# Patient Record
Sex: Male | Born: 1960 | Race: Black or African American | Hispanic: No | Marital: Single | Smoking: Never smoker
Health system: Southern US, Community
[De-identification: ages and names within clinical notes are randomized; demographics above are authoritative.]

## PROBLEM LIST (undated history)

## (undated) DIAGNOSIS — I10 Essential (primary) hypertension: Secondary | ICD-10-CM

## (undated) DIAGNOSIS — E669 Obesity, unspecified: Secondary | ICD-10-CM

## (undated) HISTORY — DX: Essential (primary) hypertension: I10

---

## 2003-07-14 ENCOUNTER — Emergency Department (HOSPITAL_COMMUNITY): Admission: EM | Admit: 2003-07-14 | Discharge: 2003-07-14 | Payer: Self-pay | Admitting: Emergency Medicine

## 2004-11-01 ENCOUNTER — Emergency Department (HOSPITAL_COMMUNITY): Admission: EM | Admit: 2004-11-01 | Discharge: 2004-11-01 | Payer: Self-pay | Admitting: Family Medicine

## 2004-11-01 ENCOUNTER — Emergency Department (HOSPITAL_COMMUNITY): Admission: EM | Admit: 2004-11-01 | Discharge: 2004-11-01 | Payer: Self-pay | Admitting: Emergency Medicine

## 2006-10-04 ENCOUNTER — Emergency Department (HOSPITAL_COMMUNITY): Admission: EM | Admit: 2006-10-04 | Discharge: 2006-10-04 | Payer: Self-pay | Admitting: Emergency Medicine

## 2008-02-19 ENCOUNTER — Emergency Department (HOSPITAL_COMMUNITY): Admission: EM | Admit: 2008-02-19 | Discharge: 2008-02-19 | Payer: Self-pay

## 2010-01-10 ENCOUNTER — Emergency Department (HOSPITAL_COMMUNITY): Admission: EM | Admit: 2010-01-10 | Discharge: 2010-01-10 | Payer: Self-pay | Admitting: Family Medicine

## 2011-03-15 LAB — POCT I-STAT, CHEM 8
BUN: 12 mg/dL (ref 6–23)
Chloride: 106 mEq/L (ref 96–112)
Creatinine, Ser: 1.4 mg/dL (ref 0.4–1.5)
Potassium: 3.8 mEq/L (ref 3.5–5.1)
Sodium: 139 mEq/L (ref 135–145)
TCO2: 27 mmol/L (ref 0–100)
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
## 2012-02-02 ENCOUNTER — Encounter (HOSPITAL_COMMUNITY): Payer: Self-pay | Admitting: *Deleted

## 2012-02-02 ENCOUNTER — Emergency Department (HOSPITAL_COMMUNITY)
Admission: EM | Admit: 2012-02-02 | Discharge: 2012-02-02 | Disposition: A | Discharge status: Home / Self Care | Payer: Self-pay | Attending: Emergency Medicine | Admitting: Emergency Medicine

## 2012-02-02 DIAGNOSIS — M79609 Pain in unspecified limb: Secondary | ICD-10-CM | POA: Insufficient documentation

## 2012-02-02 DIAGNOSIS — M545 Low back pain, unspecified: Secondary | ICD-10-CM | POA: Insufficient documentation

## 2012-02-02 DIAGNOSIS — M5380 Other specified dorsopathies, site unspecified: Secondary | ICD-10-CM | POA: Insufficient documentation

## 2012-02-02 MED ORDER — PREDNISONE 10 MG PO TABS: 20.0000 mg | ORAL_TABLET | Freq: Two times a day (BID) | ORAL | Status: DC

## 2012-02-02 MED ORDER — OXYCODONE-ACETAMINOPHEN 5-325 MG PO TABS
2.0000 | ORAL_TABLET | Freq: Once | ORAL | Status: AC
  Administered 2012-02-02: 2 via ORAL
  Filled 2012-02-02: qty 2

## 2012-02-02 MED ORDER — PREDNISONE 20 MG PO TABS
60.0000 mg | ORAL_TABLET | Freq: Once | ORAL | Status: AC
  Administered 2012-02-02: 60 mg via ORAL
  Filled 2012-02-02: qty 3

## 2012-02-02 MED ORDER — CYCLOBENZAPRINE HCL 10 MG PO TABS: 5.0000 mg | ORAL_TABLET | Freq: Two times a day (BID) | ORAL | Status: AC | PRN

## 2012-02-02 MED ORDER — OXYCODONE-ACETAMINOPHEN 5-325 MG PO TABS: 1.0000 | ORAL_TABLET | Freq: Four times a day (QID) | ORAL | Status: AC | PRN

## 2012-02-02 NOTE — ED Notes (Signed)
Pt is here with left lower back pain that radiates down left leg that started on sunday

## 2012-02-02 NOTE — ED Provider Notes (Signed)
History     CSN: [id number]  Arrival date & time 02/02/12  0800   First MD Initiated Contact with Patient 02/02/12 0813      Chief Complaint  Patient presents with  . Back Pain    (Consider location/radiation/quality/duration/timing/severity/associated sxs/prior treatment) HPI  Back Pain: Patient presents for presents evaluation of low back problems.  Symptoms have been present for since this Saturday and include pain radiating down the left leg. Initial pain was noticed when he woke up in this morning. Symptoms are worst:when walking. Alleviating factors identifiable by patient are rest and ice.  Treatments so far initiated by patient: rest and Tylenol.  Pt does not have a history of lower back pain. Pt denies bowel and urinary incontinence. Pt is ambulatory but describes his walking as slow.    History reviewed. No pertinent past medical history.  History reviewed. No pertinent past surgical history.  No family history on file.  History  Substance Use Topics  . Smoking status: Never Smoker   . Smokeless tobacco: Not on file  . Alcohol Use: Yes     occ      Review of Systems  All other systems reviewed and are negative.    Allergies  Review of patient's allergies indicates no known allergies.  Home Medications   Current Outpatient Rx  Name Route Sig Dispense Refill  . CYCLOBENZAPRINE HCL 10 MG PO TABS Oral Take 0.5 tablets (5 mg total) by mouth 2 (two) times daily as needed for muscle spasms. 20 tablet 0  . OXYCODONE-ACETAMINOPHEN 5-325 MG PO TABS Oral Take 1 tablet by mouth every 6 (six) hours as needed for pain. 15 tablet 0  . PREDNISONE 10 MG PO TABS Oral Take 2 tablets (20 mg total) by mouth 2 (two) times daily. 14 tablet 0    BP 195/108  Pulse 74  Temp(Src) 98 F (36.7 C) (Oral)  Resp 20  Ht 6' (1.829 m)  Wt 300 lb (136.079 kg)  BMI 40.69 kg/m2  SpO2 99%  Physical Exam  Nursing note and vitals reviewed. Constitutional: He is oriented to person,  place, and time. He appears well-developed and well-nourished.  HENT:  Head: Normocephalic and atraumatic.  Eyes: EOM are normal. Pupils are equal, round, and reactive to light.  Neck: Normal range of motion.  Cardiovascular: Normal rate and regular rhythm.   Pulmonary/Chest: Effort normal.  Musculoskeletal: Normal range of motion. He exhibits tenderness.       Lumbar back: He exhibits tenderness (left lower paraspinal tenderness) and spasm. He exhibits normal range of motion, no bony tenderness, no swelling, no edema, no deformity, no laceration, no pain and normal pulse.       Normal strength in both LE bilaterally and normal sensation  Neurological: He is oriented to person, place, and time.  Skin: Skin is warm and dry.    ED Course  Procedures (including critical care time)  Labs Reviewed - No data to display No results found.   1. Low back pain       MDM  Pt has full strength bilaterally and is ambulatory. Pt given prednisone and Percocet in ED and then discharged with Flexiril, prednisone and Percocets. Pt also given Ortho referral in case pain persists.         Dorthula Matas, PA 02/02/12 1544  Dorthula Matas, PA 02/02/12 1545

## 2012-02-04 NOTE — ED Provider Notes (Signed)
Medical screening examination/treatment/procedure(s) were performed by non-physician practitioner and as supervising physician I was immediately available for consultation/collaboration.   Forbes Cellar, MD 02/04/12 1423

## 2012-12-10 ENCOUNTER — Encounter (HOSPITAL_COMMUNITY): Payer: Self-pay | Admitting: Family Medicine

## 2012-12-10 ENCOUNTER — Emergency Department (HOSPITAL_COMMUNITY)
Admission: EM | Admit: 2012-12-10 | Discharge: 2012-12-10 | Disposition: A | Discharge status: Home / Self Care | Payer: Self-pay | Attending: Emergency Medicine | Admitting: Emergency Medicine

## 2012-12-10 ENCOUNTER — Emergency Department (HOSPITAL_COMMUNITY): Payer: Self-pay

## 2012-12-10 DIAGNOSIS — Y939 Activity, unspecified: Secondary | ICD-10-CM | POA: Insufficient documentation

## 2012-12-10 DIAGNOSIS — S40029A Contusion of unspecified upper arm, initial encounter: Secondary | ICD-10-CM | POA: Insufficient documentation

## 2012-12-10 DIAGNOSIS — S8000XA Contusion of unspecified knee, initial encounter: Secondary | ICD-10-CM | POA: Insufficient documentation

## 2012-12-10 DIAGNOSIS — W108XXA Fall (on) (from) other stairs and steps, initial encounter: Secondary | ICD-10-CM | POA: Insufficient documentation

## 2012-12-10 DIAGNOSIS — Y92009 Unspecified place in unspecified non-institutional (private) residence as the place of occurrence of the external cause: Secondary | ICD-10-CM | POA: Insufficient documentation

## 2012-12-10 DIAGNOSIS — IMO0002 Reserved for concepts with insufficient information to code with codable children: Secondary | ICD-10-CM | POA: Insufficient documentation

## 2012-12-10 DIAGNOSIS — W19XXXA Unspecified fall, initial encounter: Secondary | ICD-10-CM

## 2012-12-10 MED ORDER — IBUPROFEN 400 MG PO TABS
600.0000 mg | ORAL_TABLET | Freq: Once | ORAL | Status: AC
  Administered 2012-12-10: 600 mg via ORAL
  Filled 2012-12-10: qty 1

## 2012-12-10 MED ORDER — IBUPROFEN 600 MG PO TABS: 600.0000 mg | ORAL_TABLET | Freq: Four times a day (QID) | ORAL | Status: DC | PRN

## 2012-12-10 MED ORDER — HYDROCODONE-ACETAMINOPHEN 5-500 MG PO TABS: 1.0000 | ORAL_TABLET | Freq: Four times a day (QID) | ORAL | Status: DC | PRN

## 2012-12-10 NOTE — ED Provider Notes (Signed)
History  Scribed for Suzi Roots, MD, the patient was seen in room TR06C/TR06C. This chart was scribed by Candelaria Stagers. The patient's care started at 11:13 AM   CSN: [id number]  Arrival date & time 12/10/12  1008   First MD Initiated Contact with Patient 12/10/12 1108      Chief Complaint  Patient presents with  . Fall    The history is provided by the patient. No language interpreter was used.   [name] is a 51 y.o. male who presents to the Emergency Department complaining of left arm, left flank pain, and left leg pain after falling down a flight of stairs inside his house last night. Pain mild to mod, constant, dull. Non radiating. No neck/back pain. He denies hitting his head or LOC.  He denies headaches or back pain, chest pain, abdominal pain, numbness, or tingling.  Nothing seems to make the sx better or worse. Denies faintness or dizziness, no loc - was mechanical fall, missed a step.   History reviewed. No pertinent past medical history.  History reviewed. No pertinent past surgical history.  History reviewed. No pertinent family history.  History  Substance Use Topics  . Smoking status: Never Smoker   . Smokeless tobacco: Not on file  . Alcohol Use: Yes     Comment: occ      Review of Systems  Cardiovascular: Negative for chest pain.  Gastrointestinal: Negative for abdominal pain.  Musculoskeletal: Positive for arthralgias (left arm pain, left leg pain).  Skin: Negative for wound.  Neurological: Negative for syncope, weakness, numbness and headaches.  All other systems reviewed and are negative.    Allergies  Review of patient's allergies indicates no known allergies.  Home Medications   Current Outpatient Rx  Name  Route  Sig  Dispense  Refill  . ACETAMINOPHEN 500 MG PO TABS   Oral   Take 1,000 mg by mouth every 6 (six) hours as needed. For pain           BP 155/87  Pulse 87  Temp 98 F (36.7 C)  Resp 18  SpO2 100%  Physical  Exam  Nursing note and vitals reviewed. Constitutional: He is oriented to person, place, and time. He appears well-developed and well-nourished. No distress.       Awake, alert, nontoxic appearance with baseline speech for patient.  HENT:  Head: Atraumatic.  Eyes: Conjunctivae normal are normal. Pupils are equal, round, and reactive to light.  Neck: Neck supple.  Cardiovascular: Normal rate.   Pulmonary/Chest: Effort normal. No respiratory distress. He exhibits no tenderness.  Abdominal: Soft. He exhibits no distension. There is no tenderness.       No abd contusion or bruising noted.   Genitourinary:       No cva tenderness  Musculoskeletal: He exhibits no edema.       Left knee pain/tenderness, no effusion. Knee stable. Good rom bil extremities, no other focal bony tenderness noted. Distal pulses palp.  CTLS spine, non tender, aligned, no step off.   Neurological: He is alert and oriented to person, place, and time.       Awake, alert, cooperative and aware of situation; motor intact bil. Steady gait.   Skin: Skin is warm and dry. He is not diaphoretic.  Psychiatric: He has a normal mood and affect.    ED Course  Procedures   DIAGNOSTIC STUDIES: Oxygen Saturation is 100% on room air, normal by my interpretation.    COORDINATION OF  CARE:  11:19 Ordered: DG Knee Complete 4 Views Left   Dg Knee Complete 4 Views Left  12/10/2012  *RADIOLOGY REPORT*  Clinical Data: Fall, left knee pain  LEFT KNEE - COMPLETE 4+ VIEW  Comparison: None.  Findings: No fracture dislocation of the left knee.  No joint effusion.  IMPRESSION: No fracture or dislocation.   Original Report Authenticated By: Genevive Bi, M.D.        MDM  I personally performed the services described in this documentation, which was scribed in my presence. The recorded information has been reviewed and is accurate.  Spine non tender. abd soft nt. Pt ambulatory in ed.   xrays negative.  Motrin po.  Pt stable  for d/c.        Suzi Roots, MD 12/10/12 1225

## 2012-12-10 NOTE — ED Notes (Signed)
Per pt fell down some stairs yesterday and is having left arm pain and side pain. Denies hitting head or LOC

## 2013-08-29 ENCOUNTER — Emergency Department (HOSPITAL_COMMUNITY): Payer: Self-pay

## 2013-08-29 ENCOUNTER — Emergency Department (HOSPITAL_COMMUNITY)
Admission: EM | Admit: 2013-08-29 | Discharge: 2013-08-29 | Disposition: A | Discharge status: Home / Self Care | Payer: Self-pay | Attending: Emergency Medicine | Admitting: Emergency Medicine

## 2013-08-29 DIAGNOSIS — B354 Tinea corporis: Secondary | ICD-10-CM | POA: Insufficient documentation

## 2013-08-29 DIAGNOSIS — R21 Rash and other nonspecific skin eruption: Secondary | ICD-10-CM | POA: Insufficient documentation

## 2013-08-29 MED ORDER — IBUPROFEN 100 MG/5ML PO SUSP
ORAL | Status: AC
  Administered 2013-08-29: 400 mg
  Filled 2013-08-29: qty 20

## 2013-08-29 MED ORDER — IBUPROFEN 400 MG PO TABS: 400.0000 mg | ORAL_TABLET | Freq: Once | ORAL | Status: DC

## 2013-08-29 MED ORDER — CLOTRIMAZOLE-BETAMETHASONE 1-0.05 % EX CREA: TOPICAL_CREAM | Freq: Two times a day (BID) | CUTANEOUS | Status: DC

## 2013-08-29 NOTE — ED Provider Notes (Signed)
CSN: [id number]     Arrival date & time 08/29/13  4098 History   First MD Initiated Contact with Patient 08/29/13 [phone]     No chief complaint on file.  (Consider location/radiation/quality/duration/timing/severity/associated sxs/prior Treatment) Patient is a 52 y.o. male presenting with rash. The history is provided by the patient. No language interpreter was used.  Rash Quality: itchiness, painful and redness   Pain details:    Quality:  Aching   Severity:  Mild   Onset quality:  Gradual   Duration:  2 months   Timing:  Constant   Progression:  Worsening Severity:  Mild Onset quality:  Gradual Duration:  2 months Timing:  Constant Progression:  Worsening Chronicity:  New Context: not exposure to similar rash, not medications, not new detergent/soap and not sick contacts   Relieved by:  Nothing Worsened by:  Contact (walking) Ineffective treatments:  None tried Associated symptoms: no abdominal pain, no diarrhea, no fever, no nausea, no shortness of breath, no sore throat, no throat swelling, no tongue swelling, not vomiting and not wheezing     No past medical history on file. No past surgical history on file. No family history on file. History  Substance Use Topics  . Smoking status: Never Smoker   . Smokeless tobacco: Not on file  . Alcohol Use: Yes     Comment: occ    Review of Systems  Constitutional: Negative for fever.  HENT: Negative for congestion, sore throat and rhinorrhea.   Respiratory: Negative for cough, shortness of breath and wheezing.   Cardiovascular: Negative for chest pain.  Gastrointestinal: Negative for nausea, vomiting, abdominal pain and diarrhea.  Genitourinary: Negative for dysuria and hematuria.  Skin: Positive for rash.  Neurological: Negative for syncope and light-headedness.  All other systems reviewed and are negative.    Allergies  Review of patient's allergies indicates no known allergies.  Home Medications   Current  Outpatient Rx  Name  Route  Sig  Dispense  Refill  . acetaminophen (TYLENOL) 500 MG tablet   Oral   Take 1,000 mg by mouth every 6 (six) hours as needed. For pain         . HYDROcodone-acetaminophen (VICODIN) 5-500 MG per tablet   Oral   Take 1-2 tablets by mouth every 6 (six) hours as needed for pain.   15 tablet   0   . ibuprofen (ADVIL,MOTRIN) 600 MG tablet   Oral   Take 1 tablet (600 mg total) by mouth every 6 (six) hours as needed for pain. Take with food.   20 tablet   0    BP 148/98  Pulse 57  Temp(Src) 98.2 F (36.8 C) (Oral)  Resp 16  Ht 6' (1.829 m)  Wt 280 lb (127.007 kg)  BMI 37.97 kg/m2  SpO2 97% Physical Exam  Nursing note and vitals reviewed. Constitutional: He is oriented to person, place, and time. He appears well-developed and well-nourished. No distress.  HENT:  Head: Normocephalic and atraumatic.  Eyes: EOM are normal. Pupils are equal, round, and reactive to light.  Neck: Normal range of motion. Neck supple.  Cardiovascular: Normal rate, regular rhythm, normal heart sounds and intact distal pulses.  Exam reveals no gallop and no friction rub.   No murmur heard. Pulmonary/Chest: Effort normal and breath sounds normal. No respiratory distress. He has no wheezes. He has no rales. He exhibits tenderness.  Abdominal: Soft. Bowel sounds are normal. He exhibits no distension. There is no tenderness. There is no rebound.  Musculoskeletal: Normal range of motion. He exhibits no edema and no tenderness.  Lymphadenopathy:    He has no cervical adenopathy.  Neurological: He is alert and oriented to person, place, and time.  10x5 patch of raised erythematous rash on left axillary line  Skin: Skin is warm. Rash noted. He is not diaphoretic.  Psychiatric: He has a normal mood and affect. His behavior is normal.    ED Course  Procedures (including critical care time) Labs Review Labs Reviewed - No data to display Imaging Review Dg Chest 2 View  08/29/2013    *RADIOLOGY REPORT*  Clinical Data: Chest wall rash  CHEST - 2 VIEW  Comparison: None.  Findings: The heart and pulmonary vascularity are within normal limits.  The lungs are clear bilaterally.  No acute bony abnormality is seen.  IMPRESSION: No acute abnormality noted.   Original Report Authenticated By: Alcide Clever, M.D.    MDM  No diagnosis found. 8:26 AM Pt is a 52 y.o. male who presents to the ED with rash. Rash noted 2 months ago. Rash described as painful/itchy/red. No change in soaps/detergents/diet. Denies wheezing, shortness of breath, nausea, vomiting or chest pain. Denies contact with poison ivy/oak. Denies fever, chills. Pt stated has pain with palpation of there rash and with walking. Pt stated at times rash is itchy and painful   On exam: AFVSS, macular patch non vesicular. Linear consistent with scabies. No Rash noted on palms and soles. TTP around rash, lungs CTA.  Doubt shingles given non vesicular. No anaphylaxis like symptoms. Appears to be tinea corporis. Given pain with walking an palpation, will obtain CXr to rule out rib fracture or ptx. No history of trauma or falls per pt.  CXR Pa/LAt for rash per my read showed no ptx, no rib fractures. Will have pt get Lotrisone for tinea infection and see PCP in 1 week.  9:13 AM: I have discussed the diagnosis/risks/treatment options with the patient and believe the pt to be eligible for discharge home to follow-up with PCP in 1 week. We also discussed returning to the ED immediately if new or worsening sx occur. We discussed the sx which are most concerning (e.g., worsening rash, anaphylaxis symptoms) that necessitate immediate return. Any new prescriptions provided to the patient are listed below.   New Prescriptions   CLOTRIMAZOLE-BETAMETHASONE (LOTRISONE) CREAM    Apply topically 2 (two) times daily.    The patient appears reasonably screened and/or stabilized for discharge and I doubt any other medical condition or other [facility]  requiring further screening, evaluation or treatment in the ED at this time prior to discharge . Pt in agreement with discharge plan. Return precautions given. Pt discharged VSS   Imaging reviewed by myself and considered in medical decision making if ordered.  Imaging interpreted by radiology. Pt was discussed with my attending, Dr. Ron Agee, MD 08/29/13 [phone]

## 2013-08-29 NOTE — ED Provider Notes (Signed)
I saw and evaluated the patient, reviewed the resident's note and I agree with the findings and plan.  This patient is a 52 year old otherwise healthy male presents with complaints of rash on the left torso on the lateral aspect of the chest wall. This has been present for approximately 2 months and has been worsening. He describes it as itchy and also burns. He denies any fevers or chills. He denies any new exposures.  On exam vitals are stable the patient is afebrile. He is awake alert and oriented. Examination of the heart and lungs is unremarkable. There is a 10 cm x 5 cm scaly, well-demarcated, pruritic area to the left lateral chest wall under the left axilla.  Chest x-ray was performed which was negative for pneumonia or pneumothorax. It appears as though this rash is some form of tinea corporis. This will be treated with Lotrisone. This does not appear to be a shingles as the rash is not vesicular and is not in any dermatomal pattern. He will return should his symptoms worsen and followup if he does not improve.  Geoffery Lyons, MD 08/29/13 [phone]

## 2013-08-29 NOTE — ED Notes (Signed)
Pt states that he has had a rash to his L rib cage x 1 week that is associated with pain and palpation.  No distress noted.

## 2013-12-15 ENCOUNTER — Emergency Department (HOSPITAL_COMMUNITY)
Admission: EM | Admit: 2013-12-15 | Discharge: 2013-12-15 | Disposition: A | Discharge status: Home / Self Care | Payer: Self-pay | Attending: Emergency Medicine | Admitting: Emergency Medicine

## 2013-12-15 ENCOUNTER — Encounter (HOSPITAL_COMMUNITY): Payer: Self-pay | Admitting: Emergency Medicine

## 2013-12-15 ENCOUNTER — Emergency Department (HOSPITAL_COMMUNITY): Payer: Self-pay

## 2013-12-15 DIAGNOSIS — Z79899 Other long term (current) drug therapy: Secondary | ICD-10-CM | POA: Insufficient documentation

## 2013-12-15 DIAGNOSIS — R0789 Other chest pain: Secondary | ICD-10-CM | POA: Insufficient documentation

## 2013-12-15 LAB — CBC WITH DIFFERENTIAL/PLATELET
Eosinophils Absolute: 0 10*3/uL (ref 0.0–0.7)
Eosinophils Relative: 0 % (ref 0–5)
HCT: 43.1 % (ref 39.0–52.0)
Hemoglobin: 14.8 g/dL (ref 13.0–17.0)
Lymphocytes Relative: 11 % — ABNORMAL LOW (ref 12–46)
Lymphs Abs: 1.2 10*3/uL (ref 0.7–4.0)
MCH: 25 pg — ABNORMAL LOW (ref 26.0–34.0)
MCV: 72.7 fL — ABNORMAL LOW (ref 78.0–100.0)
Monocytes Relative: 6 % (ref 3–12)
Platelets: 177 10*3/uL (ref 150–400)
RBC: 5.93 MIL/uL — ABNORMAL HIGH (ref 4.22–5.81)
WBC: 10.9 10*3/uL — ABNORMAL HIGH (ref 4.0–10.5)

## 2013-12-15 LAB — POCT I-STAT, CHEM 8
BUN: 10 mg/dL (ref 6–23)
Creatinine, Ser: 1.2 mg/dL (ref 0.50–1.35)
Glucose, Bld: 96 mg/dL (ref 70–99)
Hemoglobin: 17 g/dL (ref 13.0–17.0)
Sodium: 137 mEq/L (ref 135–145)
TCO2: 21 mmol/L (ref 0–100)

## 2013-12-15 LAB — POCT I-STAT TROPONIN I

## 2013-12-15 MED ORDER — AZITHROMYCIN 250 MG PO TABS: 250.0000 mg | ORAL_TABLET | Freq: Every day | ORAL | Status: DC

## 2013-12-15 NOTE — ED Notes (Signed)
Patient transported to X-ray 

## 2013-12-15 NOTE — ED Notes (Signed)
Pt reports right sided chest pain onset last night while sleeping. Pt denies pain at present.

## 2013-12-15 NOTE — ED Provider Notes (Signed)
CSN: [id number]     Arrival date & time 12/15/13  0747 History   First MD Initiated Contact with Patient 12/15/13 [phone]     Chief Complaint  Patient presents with  . Chest Pain   (Consider location/radiation/quality/duration/timing/severity/associated sxs/prior Treatment) HPI  52 year old male with no significant past medical history presents complaining of chest pain. Patient report last night he was awoke from sleep with sharp pain to his right chest. Pain is intermittent, non radiating.  Pain lasting for 5-10 minutes resolved. He was awoke again with the same pain around 6 AM which lasted for 5 minutes and has resolved. Although pain has fully resolved he has never had this pain before and would like to have evaluated in the ED. States he did drink some water last night which has helped. Nothing seems to make the symptom worse. There is no associated exertional chest pain, lightheadedness, dizziness, shortness of breath, diaphoresis. He denies fever, chills, headache, runny nose, sneezing, cough, back pain, abdominal pain, or rash. 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 Denies any recent strenuous activities. He is a nonsmoker. Denies any recent risk factors for PE or DVT. No history of cancer, recent surgery, prolonged bed rest, long trip, calf pain or swelling. No history of heartburn.   History reviewed. No pertinent past medical history. History reviewed. No pertinent past surgical history. No family history on file. History  Substance Use Topics  . Smoking status: Never Smoker   . Smokeless tobacco: Not on file  . Alcohol Use: Yes     Comment: beer    Review of Systems  All other systems reviewed and are negative.    Allergies  Review of patient's allergies indicates no known allergies.  Home Medications   Current Outpatient Rx  Name  Route  Sig  Dispense  Refill  . clotrimazole-betamethasone (LOTRISONE) cream   Topical   Apply topically 2 (two) times daily.   30 g   1    BP 177/94  Pulse 89  Temp(Src) 98.9 F (37.2 C) (Oral)  Resp 16  Ht 6' (1.829 m)  Wt 280 lb (127.007 kg)  BMI 37.97 kg/m2  SpO2 98% Physical Exam  Nursing note and vitals reviewed. Constitutional: He appears well-developed and well-nourished. No distress.  Awake, alert, nontoxic appearance  HENT:  Head: Atraumatic.  Eyes: Conjunctivae are normal. Right eye exhibits no discharge. Left eye exhibits no discharge.  Neck: Normal range of motion. Neck supple. No JVD present.  Cardiovascular: Normal rate and regular rhythm.   Pulmonary/Chest: Effort normal. No respiratory distress. He exhibits no tenderness.  Abdominal: Soft. There is no tenderness. There is no rebound.  Musculoskeletal: He exhibits no edema and no tenderness.  ROM appears intact, no obvious focal weakness  Neurological: He is alert.  Skin: Skin is warm and dry. No rash noted.  Psychiatric: He has a normal mood and affect.    ED Course  Procedures (including critical care time)   Date: 12/15/2013  Rate: 90  Rhythm: normal sinus rhythm  QRS Axis: normal  Intervals: normal  ST/T Wave abnormalities: normal  Conduction Disutrbances: none  Narrative Interpretation:   Old EKG Reviewed: none for comparison      Pt with intermittent sharp chest pain to R chest.  Is PERC negative, TIMI 0.  Currently sxs free.    10:22 AM CXR cannot exclude R middle lobe pna.  Pt with mildly elevated WBC of 10.9 and a left shift.  However he is afebrile, denies productive  cough.  I will provide abx for pt to use if he develops fever, cough, or worsening cp.  Otherwise, normal ECG, trop neg, and electrolytes are reassuring.  Pt request a note for court as he missed his court date today.      Labs Review Labs Reviewed  CBC WITH DIFFERENTIAL - Abnormal; Notable for the following:    WBC 10.9 (*)    RBC 5.93 (*)    MCV 72.7 (*)    MCH 25.0 (*)    RDW 15.9 (*)    Neutrophils Relative % 83 (*)    Neutro Abs 9.0 (*)     Lymphocytes Relative 11 (*)    All other components within normal limits  POCT I-STAT, CHEM 8  POCT I-STAT TROPONIN I   Imaging Review Dg Chest 2 View  12/15/2013   CLINICAL DATA:  Chest pain.  EXAM: CHEST  2 VIEW  COMPARISON:  08/29/2013.  FINDINGS: Poor inspiration with mild subsegmental atelectasis. Mild developing infiltrate right middle lobe cannot be excluded. Heart size and pulmonary vascularity normal. No pleural effusion or pneumothorax. No acute osseous abnormality.  IMPRESSION: Poor inspiration with mild atelectatic changes. Mild infiltrate right middle lobe cannot be excluded.   Electronically Signed   By: Maisie Fus  Register   On: 12/15/2013 08:53    EKG Interpretation   None       MDM   1. Chest pain, atypical    BP 152/83  Pulse 67  Temp(Src) 98.9 F (37.2 C) (Oral)  Resp 17  Ht 6' (1.829 m)  Wt 280 lb (127.007 kg)  BMI 37.97 kg/m2  SpO2 97%  I have reviewed nursing notes and vital signs. I personally reviewed the imaging tests through PACS system  I reviewed available ER/hospitalization records thought the EMR     Fayrene Helper, PA-C 12/15/13 1025

## 2013-12-16 NOTE — ED Provider Notes (Signed)
Medical screening examination/treatment/procedure(s) were performed by non-physician practitioner and as supervising physician I was immediately available for consultation/collaboration.  EKG Interpretation   None        Johanne Mcglade R. Rolf Fells, MD 12/16/13 0715 

## 2016-05-14 ENCOUNTER — Emergency Department (HOSPITAL_COMMUNITY)
Admission: EM | Admit: 2016-05-14 | Discharge: 2016-05-14 | Disposition: A | Discharge status: Home / Self Care | Payer: Self-pay | Attending: Emergency Medicine | Admitting: Emergency Medicine

## 2016-05-14 ENCOUNTER — Encounter (HOSPITAL_COMMUNITY): Payer: Self-pay | Admitting: Emergency Medicine

## 2016-05-14 DIAGNOSIS — I10 Essential (primary) hypertension: Secondary | ICD-10-CM | POA: Insufficient documentation

## 2016-05-14 DIAGNOSIS — Z792 Long term (current) use of antibiotics: Secondary | ICD-10-CM | POA: Insufficient documentation

## 2016-05-14 DIAGNOSIS — R0602 Shortness of breath: Secondary | ICD-10-CM | POA: Insufficient documentation

## 2016-05-14 LAB — BASIC METABOLIC PANEL
Anion gap: 11 (ref 5–15)
BUN: 12 mg/dL (ref 6–20)
CHLORIDE: 104 mmol/L (ref 101–111)
CO2: 22 mmol/L (ref 22–32)
CREATININE: 1.1 mg/dL (ref 0.61–1.24)
Calcium: 9 mg/dL (ref 8.9–10.3)
GFR calc Af Amer: >60 mL/min (ref >60)
GFR calc non Af Amer: >60 mL/min (ref >60)
Glucose, Bld: 100 mg/dL — ABNORMAL HIGH (ref 65–99)
Potassium: 4 mmol/L (ref 3.5–5.1)
SODIUM: 137 mmol/L (ref 135–145)

## 2016-05-14 MED ORDER — AMLODIPINE BESYLATE 10 MG PO TABS: 5.0000 mg | ORAL_TABLET | Freq: Every day | ORAL | Status: DC

## 2016-05-14 NOTE — ED Provider Notes (Signed)
CSN: [id number]     Arrival date & time 05/14/16  [id number] History   First MD Initiated Contact with Patient 05/14/16 [phone]     Chief Complaint  Patient presents with  . 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 He took his blood pressure was 200/100. Patient does not see a physician regularly. No known history of hypertension. No medications taken yesterday. Has felt normal today. Will want to come to checked out. Denies any associated chest pain. No recent illnesses.  Patient is a 55 y.o. male presenting with hypertension. The history is provided by the patient.  Hypertension This is a new problem. The current episode started more than 1 week ago. The problem occurs constantly. The problem has been resolved. Associated symptoms include shortness of breath. Pertinent negatives include no chest pain.    History reviewed. No pertinent past medical history. History reviewed. No pertinent past surgical history. History reviewed. No pertinent family history. Social History  Substance Use Topics  . Smoking status: Never Smoker   . Smokeless tobacco: None  . Alcohol Use: Yes     Comment: beer    Review of Systems  Respiratory: Positive for shortness of breath.   Cardiovascular: Negative for chest pain.  All other systems reviewed and are negative.     Allergies  Review of patient's allergies indicates no known allergies.  Home Medications   Prior to Admission medications   Medication Sig Start Date End Date Taking? Authorizing Provider  acetaminophen (TYLENOL) 325 MG tablet Take 650 mg by mouth every 6 (six) hours as needed.    Historical Provider, MD  azithromycin (ZITHROMAX) 250 MG tablet Take 1 tablet (250 mg total) by mouth daily. 12/15/13   Fayrene HelperBowie Tran, PA-C   BP 170/90 mmHg  Pulse 72  Temp(Src) 97.6 F (36.4 C) (Oral)  Resp 20   SpO2 97% Physical Exam  Constitutional: He is oriented to person, place, and time. He appears well-developed and well-nourished.  Non-toxic appearance. No distress.  HENT:  Head: Normocephalic and atraumatic.  Eyes: Conjunctivae, EOM and lids are normal. Pupils are equal, round, and reactive to light.  Neck: Normal range of motion. Neck supple. No tracheal deviation present. No thyroid mass present.  Cardiovascular: Normal rate, regular rhythm and normal heart sounds.  Exam reveals no gallop.   No murmur heard. Pulmonary/Chest: Effort normal and breath sounds normal. No stridor. No respiratory distress. He has no decreased breath sounds. He has no wheezes. He has no rhonchi. He has no rales.  Abdominal: Soft. Normal appearance and bowel sounds are normal. He exhibits no distension. There is no tenderness. There is no rebound and no CVA tenderness.  Musculoskeletal: Normal range of motion. He exhibits no edema or tenderness.  Neurological: He is alert and oriented to person, place, and time. He has normal strength. No cranial nerve deficit or sensory deficit. GCS eye subscore is 4. GCS verbal subscore is 5. GCS motor subscore is 6.  Skin: Skin is warm and dry. No abrasion and no rash noted.  Psychiatric: He has a normal mood and affect. His speech is normal and behavior is normal.  Nursing note and vitals reviewed.   ED Course  Procedures (including critical care time) Labs Review Labs Reviewed  BASIC METABOLIC PANEL    Imaging Review No results found. I have personally reviewed and evaluated these images and lab results as part of my medical decision-making.  EKG Interpretation   Date/Time:  Thursday May 14 2016 08:53:19 EDT Ventricular Rate:  58 PR Interval:  207 QRS Duration: 102 QT Interval:  401 QTC Calculation: 394 R Axis:   88 Text Interpretation:  Sinus rhythm Borderline prolonged PR interval  Confirmed by Freida Busman  MD, Story Vanvranken ([id number]) on 05/14/2016 9:06:03 AM      MDM    Final diagnoses:  None    Will start patient on Norvasc and give referral to [facility]    Lorre Nick, MD 05/14/16 [phone]

## 2016-05-14 NOTE — ED Notes (Addendum)
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 Currently 170/90. Reports he thought he may pass out and got hot due to working in kitchen. Also reports SOB during this episode. Denies pain. Has never passed out and this was first episode like this that he has ever had.

## 2016-05-14 NOTE — ED Notes (Signed)
Pt resting in no distress.

## 2016-05-14 NOTE — ED Notes (Signed)
PT ambulated with baseline gait; VSS; A&Ox3; no signs of distress; respirations even and unlabored; skin warm and dry; no questions upon discharge.  

## 2016-05-14 NOTE — Discharge Instructions (Signed)
Hypertension Hypertension, commonly called high blood pressure, is when the force of blood pumping through your arteries is too strong. Your arteries are the blood vessels that carry blood from your heart throughout your body. A blood pressure reading consists of a higher number over a lower number, such as 110/72. The higher number (systolic) is the pressure inside your arteries when your heart pumps. The lower number (diastolic) is the pressure inside your arteries when your heart relaxes. Ideally you want your blood pressure below 120/80. Hypertension forces your heart to work harder to pump blood. Your arteries may become narrow or stiff. Having untreated or uncontrolled hypertension can cause heart attack, stroke, kidney disease, and other problems. RISK FACTORS Some risk factors for high blood pressure are controllable. Others are not.  Risk factors you cannot control include:   Race. You may be at higher risk if you are African American.  Age. Risk increases with age.  Gender. Men are at higher risk than women before age 45 years. After age 65, women are at higher risk than men. Risk factors you can control include:  Not getting enough exercise or physical activity.  Being overweight.  Getting too much fat, sugar, calories, or salt in your diet.  Drinking too much alcohol. SIGNS AND SYMPTOMS Hypertension does not usually cause signs or symptoms. Extremely high blood pressure (hypertensive crisis) may cause headache, anxiety, shortness of breath, and nosebleed. DIAGNOSIS To check if you have hypertension, your health care provider will measure your blood pressure while you are seated, with your arm held at the level of your heart. It should be measured at least twice using the same arm. Certain conditions can cause a difference in blood pressure between your right and left arms. A blood pressure reading that is higher than normal on one occasion does not mean that you need treatment. If  it is not clear whether you have high blood pressure, you may be asked to return on a different day to have your blood pressure checked again. Or, you may be asked to monitor your blood pressure at home for 1 or more weeks. TREATMENT Treating high blood pressure includes making lifestyle changes and possibly taking medicine. Living a healthy lifestyle can help lower high blood pressure. You may need to change some of your habits. Lifestyle changes may include:  Following the DASH diet. This diet is high in fruits, vegetables, and whole grains. It is low in salt, red meat, and added sugars.  Keep your sodium intake below 2,300 mg per day.  Getting at least 30-45 minutes of aerobic exercise at least 4 times per week.  Losing weight if necessary.  Not smoking.  Limiting alcoholic beverages.  Learning ways to reduce stress. Your health care provider may prescribe medicine if lifestyle changes are not enough to get your blood pressure under control, and if one of the following is true:  You are 18-59 years of age and your systolic blood pressure is above 140.  You are 60 years of age or older, and your systolic blood pressure is above 150.  Your diastolic blood pressure is above 90.  You have diabetes, and your systolic blood pressure is over 140 or your diastolic blood pressure is over 90.  You have kidney disease and your blood pressure is above 140/90.  You have heart disease and your blood pressure is above 140/90. Your personal target blood pressure may vary depending on your medical conditions, your age, and other factors. HOME CARE INSTRUCTIONS    Have your blood pressure rechecked as directed by your health care provider.   Take medicines only as directed by your health care provider. Follow the directions carefully. Blood pressure medicines must be taken as prescribed. The medicine does not work as well when you skip doses. Skipping doses also puts you at risk for  problems.  Do not smoke.   Monitor your blood pressure at home as directed by your health care provider. SEEK MEDICAL CARE IF:   You think you are having a reaction to medicines taken.  You have recurrent headaches or feel dizzy.  You have swelling in your ankles.  You have trouble with your vision. SEEK IMMEDIATE MEDICAL CARE IF:  You develop a severe headache or confusion.  You have unusual weakness, numbness, or feel faint.  You have severe chest or abdominal pain.  You vomit repeatedly.  You have trouble breathing. MAKE SURE YOU:   Understand these instructions.  Will watch your condition.  Will get help right away if you are not doing well or get worse.   This information is not intended to replace advice given to you by your health care provider. Make sure you discuss any questions you have with your health care provider.   Document Released: 12/14/2005 Document Revised: 04/30/2015 Document Reviewed: 10/06/2013 Elsevier Interactive Patient Education 2016 [employer]  

## 2016-06-02 ENCOUNTER — Ambulatory Visit: Payer: Self-pay | Admitting: Family Medicine

## 2016-07-02 ENCOUNTER — Ambulatory Visit: Payer: Self-pay | Admitting: Family Medicine

## 2016-10-05 ENCOUNTER — Ambulatory Visit: Payer: Self-pay | Admitting: Family Medicine

## 2017-02-09 ENCOUNTER — Ambulatory Visit: Payer: Self-pay | Admitting: Family Medicine

## 2017-09-12 ENCOUNTER — Encounter (HOSPITAL_COMMUNITY): Payer: Self-pay | Admitting: *Deleted

## 2017-09-12 ENCOUNTER — Emergency Department (HOSPITAL_COMMUNITY)
Admission: EM | Admit: 2017-09-12 | Discharge: 2017-09-12 | Disposition: A | Discharge status: Home / Self Care | Payer: Self-pay | Attending: Emergency Medicine | Admitting: Emergency Medicine

## 2017-09-12 DIAGNOSIS — M5442 Lumbago with sciatica, left side: Secondary | ICD-10-CM

## 2017-09-12 DIAGNOSIS — M5432 Sciatica, left side: Secondary | ICD-10-CM | POA: Insufficient documentation

## 2017-09-12 DIAGNOSIS — Z79899 Other long term (current) drug therapy: Secondary | ICD-10-CM | POA: Insufficient documentation

## 2017-09-12 DIAGNOSIS — M545 Low back pain: Secondary | ICD-10-CM | POA: Insufficient documentation

## 2017-09-12 HISTORY — DX: Obesity, unspecified: E66.9

## 2017-09-12 MED ORDER — ACETAMINOPHEN 500 MG PO TABS
1000.0000 mg | ORAL_TABLET | Freq: Once | ORAL | Status: AC
  Administered 2017-09-12: 1000 mg via ORAL
  Filled 2017-09-12: qty 2

## 2017-09-12 MED ORDER — CYCLOBENZAPRINE HCL 5 MG PO TABS: 5.0000 mg | ORAL_TABLET | Freq: Every evening | ORAL | 0 refills | Status: DC | PRN

## 2017-09-12 NOTE — ED Triage Notes (Signed)
Pt reports lower back pain that started yesterday. Pain radiates into his buttock. Denies injury to back. Pt is ambulatory on arrival.

## 2017-09-12 NOTE — Discharge Instructions (Signed)

## 2017-09-12 NOTE — ED Provider Notes (Signed)
[facility]-EMERGENCY DEPT Provider Note   CSN: [id number] Arrival date & time: 09/12/17  1558     History   Chief Complaint Chief Complaint  Patient presents with  . Back Pain    HPI [name] is a 56 y.o. male who presents for evaluation Of lower back pain that radiates into his left. He reports that this began after he switched from sleeping in a bed to sleeping on a couch one night. He denies any numbness, tingling, or weakness.  No changes to bowel or bladder function, no fevers, no abdominal pain.  HPI  Past Medical History:  Diagnosis Date  . Obesity     There are no active problems to display for this patient.   History reviewed. No pertinent surgical history.     Home Medications    Prior to Admission medications   Medication Sig Start Date End Date Taking? Authorizing Provider  acetaminophen (TYLENOL) 325 MG tablet Take 650 mg by mouth every 6 (six) hours as needed for mild pain.     [provider]  amLODipine (NORVASC) 10 MG tablet Take 0.5 tablets (5 mg total) by mouth daily. 05/14/16   Lorre Nick, MD  azithromycin (ZITHROMAX) 250 MG tablet Take 1 tablet (250 mg total) by mouth daily. Patient not taking: Reported on 05/14/2016 12/15/13   Fayrene Helper, PA-C  cyclobenzaprine (FLEXERIL) 5 MG tablet Take 1 tablet (5 mg total) by mouth at bedtime as needed for muscle spasms. 09/12/17   Cristina Gong, PA-C    Family History History reviewed. No pertinent family history.  Social History Social History  Substance Use Topics  . Smoking status: Never Smoker  . Smokeless tobacco: Not on file  . Alcohol use Yes     Comment: beer     Allergies   Patient has no known allergies.   Review of Systems Review of Systems  Constitutional: Negative for chills and fever.  Gastrointestinal: Negative for abdominal distention, abdominal pain, anal bleeding, blood in stool, diarrhea, nausea and vomiting.  Genitourinary: Negative for decreased urine  volume and difficulty urinating.  Musculoskeletal: Positive for back pain. Negative for arthralgias, joint swelling, myalgias, neck pain and neck stiffness.  Skin: Negative for color change, pallor and wound.  Neurological: Negative for weakness and numbness.     Physical Exam Updated Vital Signs BP (!) 183/96 (BP Location: Left Arm)   Pulse 66   Temp 98.1 F (36.7 C) (Oral)   Resp 18   SpO2 100%   Physical Exam  Constitutional: He is oriented to person, place, and time. He appears well-developed and well-nourished. No distress.  HENT:  Head: Normocephalic and atraumatic.  Cardiovascular: Intact distal pulses.   2+ DP and PT pulses bilaterally  Pulmonary/Chest: Effort normal.  Abdominal: Soft. He exhibits no distension. There is no tenderness.  Musculoskeletal:  Strength is intact and symmetrical to bilateral upper and lower extremities.  Palpation over left lumbar paraspinal muscles both re-creates and exacerbates his pain, including radiation to left buttock.  Neurological: He is alert and oriented to person, place, and time. No sensory deficit. He exhibits normal muscle tone.  Patient is able to walk on his heels and toes, states that walking is okay however moving from sitting to standing causes pain.  Skin: Skin is warm and dry. He is not diaphoretic.  Psychiatric: He has a normal mood and affect. His behavior is normal.  Nursing note and vitals reviewed.    ED Treatments / Results  Labs (all  labs ordered are listed, but only abnormal results are displayed) Labs Reviewed - No data to display  EKG  EKG Interpretation None       Radiology No results found.  Procedures Procedures (including critical care time)  Medications Ordered in ED Medications  acetaminophen (TYLENOL) tablet 1,000 mg (1,000 mg Oral Given 09/12/17 1801)     Initial Impression / Assessment and Plan / ED Course  I have reviewed the triage vital signs and the nursing notes.  Pertinent  labs & imaging results that were available during my care of the patient were reviewed by me and considered in my medical decision making (see chart for details).     Patient with back pain.  No neurological deficits and normal neuro exam.  Patient can walk but states is painful.  No loss of bowel or bladder control.  No concern for cauda equina.  No fever, night sweats, weight loss, h/o cancer, IVDU.  RICE protocol and pain medicine indicated and discussed with patient.    Final Clinical Impressions(s) / ED Diagnoses   Final diagnoses:  Acute left-sided low back pain with left-sided sciatica    New Prescriptions New Prescriptions   CYCLOBENZAPRINE (FLEXERIL) 5 MG TABLET    Take 1 tablet (5 mg total) by mouth at bedtime as needed for muscle spasms.     Cristina Gong, [place] 09/12/17 1802    Little, Ambrose Finland, MD 09/13/17 [phone]

## 2017-09-17 ENCOUNTER — Ambulatory Visit: Payer: Self-pay | Admitting: Family Medicine

## 2017-09-24 ENCOUNTER — Encounter (HOSPITAL_COMMUNITY): Payer: Self-pay | Admitting: *Deleted

## 2017-09-24 ENCOUNTER — Emergency Department (HOSPITAL_COMMUNITY): Payer: Self-pay

## 2017-09-24 ENCOUNTER — Emergency Department (HOSPITAL_COMMUNITY)
Admission: EM | Admit: 2017-09-24 | Discharge: 2017-09-24 | Disposition: A | Discharge status: Home / Self Care | Payer: Self-pay | Attending: Emergency Medicine | Admitting: Emergency Medicine

## 2017-09-24 DIAGNOSIS — Z79899 Other long term (current) drug therapy: Secondary | ICD-10-CM | POA: Insufficient documentation

## 2017-09-24 DIAGNOSIS — R0602 Shortness of breath: Secondary | ICD-10-CM | POA: Insufficient documentation

## 2017-09-24 DIAGNOSIS — I10 Essential (primary) hypertension: Secondary | ICD-10-CM | POA: Insufficient documentation

## 2017-09-24 DIAGNOSIS — R609 Edema, unspecified: Secondary | ICD-10-CM

## 2017-09-24 DIAGNOSIS — J810 Acute pulmonary edema: Secondary | ICD-10-CM | POA: Insufficient documentation

## 2017-09-24 LAB — COMPREHENSIVE METABOLIC PANEL
ALBUMIN: 3.9 g/dL (ref 3.5–5.0)
ALT: 13 U/L — ABNORMAL LOW (ref 17–63)
ANION GAP: 7 (ref 5–15)
AST: 19 U/L (ref 15–41)
Alkaline Phosphatase: 69 U/L (ref 38–126)
BUN: 11 mg/dL (ref 6–20)
CHLORIDE: 104 mmol/L (ref 101–111)
CO2: 25 mmol/L (ref 22–32)
Calcium: 9.2 mg/dL (ref 8.9–10.3)
Creatinine, Ser: 1.22 mg/dL (ref 0.61–1.24)
GFR calc Af Amer: >60 mL/min (ref >60)
GFR calc non Af Amer: >60 mL/min (ref >60)
GLUCOSE: 109 mg/dL — AB (ref 65–99)
POTASSIUM: 3.9 mmol/L (ref 3.5–5.1)
Sodium: 136 mmol/L (ref 135–145)
Total Bilirubin: 0.4 mg/dL (ref 0.3–1.2)
Total Protein: 7.1 g/dL (ref 6.5–8.1)

## 2017-09-24 LAB — CBC WITH DIFFERENTIAL/PLATELET
BASOS ABS: 0 10*3/uL (ref 0.0–0.1)
BASOS PCT: 0 %
BASOS PCT: 0 %
Basophils Absolute: 0 10*3/uL (ref 0.0–0.1)
Eosinophils Absolute: 0.2 10*3/uL (ref 0.0–0.7)
Eosinophils Absolute: 0.2 10*3/uL (ref 0.0–0.7)
Eosinophils Relative: 2 %
Eosinophils Relative: 2 %
HEMATOCRIT: 40.2 % (ref 39.0–52.0)
HEMATOCRIT: 41.1 % (ref 39.0–52.0)
HEMOGLOBIN: 12.4 g/dL — AB (ref 13.0–17.0)
HEMOGLOBIN: 13.2 g/dL (ref 13.0–17.0)
LYMPHS ABS: 2.5 10*3/uL (ref 0.7–4.0)
LYMPHS PCT: 28 %
LYMPHS PCT: 28 %
Lymphs Abs: 2.5 10*3/uL (ref 0.7–4.0)
MCH: 24 pg — AB (ref 26.0–34.0)
MCH: 22.9 pg — ABNORMAL LOW (ref 26.0–34.0)
MCHC: 32.1 g/dL (ref 30.0–36.0)
MCHC: 30.8 g/dL (ref 30.0–36.0)
MCV: 74.3 fL — AB (ref 78.0–100.0)
MCV: 74.6 fL — AB (ref 78.0–100.0)
MONO ABS: 0.6 10*3/uL (ref 0.1–1.0)
MONO ABS: 0.6 10*3/uL (ref 0.1–1.0)
MONOS PCT: 6 %
MONOS PCT: 6 %
NEUTROS ABS: 5.7 10*3/uL (ref 1.7–7.7)
NEUTROS ABS: 5.7 10*3/uL (ref 1.7–7.7)
NEUTROS PCT: 64 %
NEUTROS PCT: 64 %
Platelets: 188 10*3/uL (ref 150–400)
Platelets: 193 10*3/uL (ref 150–400)
RBC: 5.51 MIL/uL (ref 4.22–5.81)
RBC: 5.41 MIL/uL (ref 4.22–5.81)
RDW: 16.2 % — AB (ref 11.5–15.5)
RDW: 15.7 % — AB (ref 11.5–15.5)
WBC: 8.9 10*3/uL (ref 4.0–10.5)
WBC: 7.5 10*3/uL (ref 4.0–10.5)

## 2017-09-24 LAB — BRAIN NATRIURETIC PEPTIDE: B NATRIURETIC PEPTIDE 5: 27.6 pg/mL (ref 0.0–100.0)

## 2017-09-24 LAB — URINALYSIS, ROUTINE W REFLEX MICROSCOPIC
Bilirubin Urine: NEGATIVE
Glucose, UA: NEGATIVE mg/dL
Hgb urine dipstick: NEGATIVE
Ketones, ur: NEGATIVE mg/dL
Leukocytes, UA: NEGATIVE
Nitrite: NEGATIVE
Protein, ur: NEGATIVE mg/dL
Specific Gravity, Urine: 1.004 — ABNORMAL LOW (ref 1.005–1.030)
pH: 6 (ref 5.0–8.0)

## 2017-09-24 MED ORDER — HYDRALAZINE HCL 25 MG PO TABS
25.0000 mg | ORAL_TABLET | Freq: Once | ORAL | Status: AC
  Administered 2017-09-24: 25 mg via ORAL
  Filled 2017-09-24: qty 1

## 2017-09-24 MED ORDER — FUROSEMIDE 20 MG PO TABS
40.0000 mg | ORAL_TABLET | Freq: Once | ORAL | Status: AC
  Administered 2017-09-24: 40 mg via ORAL
  Filled 2017-09-24: qty 2

## 2017-09-24 MED ORDER — HYDROCHLOROTHIAZIDE 25 MG PO TABS: 25.0000 mg | ORAL_TABLET | Freq: Every day | ORAL | 0 refills | Status: DC

## 2017-09-24 NOTE — Discharge Instructions (Signed)
Medications: Hydrochlorothiazide  Treatment: Take hydrochlorothiazide for your blood pressure and swelling once daily as prescribed. Reduce your salt intake to help reduce your blood pressure and swelling.  Follow-up: Please follow-up with your doctor as soon as possible for further evaluation and treatment of your possible new onset heart failure. Please return to the emergency department immediately if you develop any chest pain, worsening shortness of breath, severe headache, or any other new or concerning symptoms.

## 2017-09-24 NOTE — ED Triage Notes (Signed)
The pt is c/o rt foot pain for 2-3 months  No known injury

## 2017-09-24 NOTE — ED Provider Notes (Signed)
Sign out from Marlon Pel, PA-C at shift change  Hx of HTN, but not treated  Briefly, patient presenting with elevated BP, worsening peripheral edema. Patient seen to be short of breath with exertion in the room, just taking off shoes, but denies shortness of breath that he has noticed himself.  Plan: Working up CHF with labs. CXR shows interstitial pulmonary edema. Anticipate admission.  CBC shows hemoglobin 13.2 ( lab called to correct this), CMP unremarkable. BNP 27.6. UA negative. Patient evaluated by Dr. Adela Lank who discussed admission for acute onset heart failure, as patient has clinical symptoms and on insulin chest x-ray. Patient prefers not to be admitted and has follow-up in one week with a new doctor. Using shared decision-making, we will discharge patient home with strict return precautions. Blood pressure reduced to 165/95 in the ED with hydralazine and Lasix. We'll discharge home with HCTZ. Patient understands and agrees with plan. Patient discharged in satisfactory condition with stable vitals.      Emi Holes, PA-C 09/24/17 1937    Melene Plan, DO 09/24/17 2255

## 2017-09-24 NOTE — ED Provider Notes (Signed)
[facility]PT Provider Note   CSN: [id number] Arrival date & time: 09/24/17  1437  History   Chief Complaint Chief Complaint  Patient presents with  . Foot Pain    HPI [name] is a 56 y.o. male with a past medical history of hypertension comes to the ER with 3 months of progressive lower extremity swelling, worse on the right and some shortness of breath. The swelling causes discomfort to his feet. He denies taking any bp medication or having hypertension but he is significantly overweight and his BP is 219/99 on arrival and 199/100 on recheck. He is not taking any medications at home. Doesn't do regular follow-ups, eat healthy or weigh himself on a regular basis.  He was noted to be hypertensive at his visit on 09/12/2017 on a visit for back pain, his BP was not addressed at this time. He denies CP. No coughing or fevers.  HPI  Past Medical History:  Diagnosis Date  . Obesity     There are no active problems to display for this patient.   History reviewed. No pertinent surgical history.   Home Medications    Prior to Admission medications   Medication Sig Start Date End Date Taking? Authorizing Provider  acetaminophen (TYLENOL) 325 MG tablet Take 650 mg by mouth every 6 (six) hours as needed for mild pain.     [provider]  amLODipine (NORVASC) 10 MG tablet Take 0.5 tablets (5 mg total) by mouth daily. 05/14/16   Lorre Nick, MD  azithromycin (ZITHROMAX) 250 MG tablet Take 1 tablet (250 mg total) by mouth daily. Patient not taking: Reported on 05/14/2016 12/15/13   Fayrene Helper, PA-C  cyclobenzaprine (FLEXERIL) 5 MG tablet Take 1 tablet (5 mg total) by mouth at bedtime as needed for muscle spasms. 09/12/17   Cristina Gong, PA-C    Family History No family history on file.  Social History Social History  Substance Use Topics  . Smoking status: Never Smoker  . Smokeless tobacco: Never Used  . Alcohol use Yes     Comment: beer      Allergies   Patient has no known allergies.   Review of Systems Review of Systems  Negative ROS aside from pertinent positives and negatives as listed in HPI  Physical Exam Updated Vital Signs BP (!) 199/100   Pulse 71   Temp 98.2 F (36.8 C) (Oral)   Resp 20   Ht 6' (1.829 m)   Wt 136.1 kg (300 lb)   SpO2 98%   BMI 40.69 kg/m   Physical Exam  Constitutional: He appears well-developed and well-nourished. No distress.  Morbidly obese.  HENT:  Head: Normocephalic and atraumatic.  Nose: Nose normal.  Mouth/Throat: Uvula is midline, oropharynx is clear and moist and mucous membranes are normal.  Eyes: Pupils are equal, round, and reactive to light.  Neck: Normal range of motion. Neck supple.  Cardiovascular: Normal rate and regular rhythm.   Pulmonary/Chest: Effort normal. No respiratory distress.  Exertional SOB.  Abdominal: Soft.  Large omentum.  Musculoskeletal:  Bilateral lower extremity swelling with 2+ pitting edema, worse on the right.  No erythema  Neurological: He is alert.  Acting at baseline  Skin: Skin is warm and dry. No rash noted.  Nursing note and vitals reviewed.    ED Treatments / Results  Labs (all labs ordered are listed, but only abnormal results are displayed) Labs Reviewed  COMPREHENSIVE METABOLIC PANEL  BRAIN NATRIURETIC PEPTIDE  CBC WITH DIFFERENTIAL/PLATELET  EKG  EKG Interpretation None       Radiology No results found.  Procedures Procedures (including critical care time)  Medications Ordered in ED Medications - No data to display   Initial Impression / Assessment and Plan / ED Course  I have reviewed the triage vital signs and the nursing notes.  Pertinent labs & imaging results that were available during my care of the patient were reviewed by me and considered in my medical decision making (see chart for details).     Concern with new onset lower extremity swelling and pitting edema with poorly  controlled and significant hypertension. Need to rule out new onset congestive heart failure. Chest xray, EKG, CMP, CBC and BNP ordered for further evaluation. Would like to give dose of PO hydralazine and lasix if we can get to room with cardiac monitoring.  4:45 pm- discussed patient with Dr. Adela Lank to make him aware of patients presentation and work-up. 5:00pm- at end of shift patient sign out to Buel Ream, PA-C  Final Clinical Impressions(s) / ED Diagnoses   Final diagnoses:  None    New Prescriptions New Prescriptions   No medications on file     Marlon Pel, PA-C 09/24/17 1643    Melene Plan, DO 09/24/17 2256

## 2018-01-21 ENCOUNTER — Ambulatory Visit: Payer: Self-pay | Admitting: Family Medicine

## 2018-02-04 ENCOUNTER — Ambulatory Visit: Payer: Self-pay | Admitting: Family Medicine

## 2018-02-22 ENCOUNTER — Ambulatory Visit (INDEPENDENT_AMBULATORY_CARE_PROVIDER_SITE_OTHER): Payer: Self-pay | Admitting: Physician Assistant

## 2018-03-21 ENCOUNTER — Ambulatory Visit (INDEPENDENT_AMBULATORY_CARE_PROVIDER_SITE_OTHER): Payer: Self-pay | Admitting: Physician Assistant

## 2018-04-26 ENCOUNTER — Ambulatory Visit (INDEPENDENT_AMBULATORY_CARE_PROVIDER_SITE_OTHER): Payer: Self-pay | Admitting: Physician Assistant

## 2018-05-04 ENCOUNTER — Encounter (INDEPENDENT_AMBULATORY_CARE_PROVIDER_SITE_OTHER): Payer: Self-pay | Admitting: Nurse Practitioner

## 2018-05-04 ENCOUNTER — Ambulatory Visit (INDEPENDENT_AMBULATORY_CARE_PROVIDER_SITE_OTHER): Payer: Self-pay | Admitting: Nurse Practitioner

## 2018-05-04 VITALS — BP 166/81 | HR 79 | Temp 97.7°F | Resp 18 | Ht 72.0 in | Wt 368.0 lb

## 2018-05-04 DIAGNOSIS — J810 Acute pulmonary edema: Secondary | ICD-10-CM

## 2018-05-04 DIAGNOSIS — I10 Essential (primary) hypertension: Secondary | ICD-10-CM

## 2018-05-04 MED ORDER — AMLODIPINE BESYLATE 10 MG PO TABS: 10.0000 mg | ORAL_TABLET | Freq: Every day | ORAL | 1 refills | Status: DC

## 2018-05-04 MED ORDER — HYDROCHLOROTHIAZIDE 25 MG PO TABS: 25.0000 mg | ORAL_TABLET | Freq: Every day | ORAL | 0 refills | Status: DC

## 2018-05-04 NOTE — Progress Notes (Signed)
Assessment & Plan:  [name] was seen today for establish care.  Diagnoses and all orders for this visit:  Essential hypertension -     amLODipine (NORVASC) 10 MG tablet; Take 1 tablet (10 mg total) by mouth daily. -     hydrochlorothiazide (HYDRODIURIL) 25 MG tablet; Take 1 tablet (25 mg total) by mouth daily. Continue all antihypertensives as prescribed.  Remember to bring in your blood pressure log with you for your follow up appointment.  DASH/Mediterranean Diets are healthier choices for HTN.    Acute pulmonary edema (HCC) Instructed patient to go to the Emergency Room  Patient has been counseled on age-appropriate routine health concerns for screening and prevention. These are reviewed and up-to-date. Referrals have been placed accordingly. Immunizations are up-to-date or declined.    Subjective:   Chief Complaint  Patient presents with  . Establish Care   HPI [name] 57 y.o. male presents to office today to establish care. He has not seen a primary care provider in "over 20 some years". He was seen in the ED on 09-24-2018 with complaints of worsening peripheral edema, shortness of breath with exertion and HTN. He was diagnosed with pulmonary edema/possible new onset heart failure and treated with hydralazine and lasix. He declined admission at that time and reportedly was supposed to follow up as OP one week after his ED admission. He did not follow up.  Today based on patient's symptoms and lack of follow up I have advised him to go to the ED for further evaluation. He declines even after I have discussed the severity of his symptoms and possible outcomes if not treated. He verbalized understanding.     Essential Hypertension He has been out of his HCTZ since October 2018. He endorses increased shortness of breath with minimal exertion, increased BLE swelling. He denies chest pain. He states his weight is up over 40lbs since last year. He did not follow up after his ED  visit due to lack of insurance and not wanting to miss any days from his jobs.   BP Readings from Last 3 Encounters:  05/04/18 (!) 166/81  09/24/17 (!) 165/95  09/12/17 (!) 179/108     Review of Systems  Constitutional: Negative for fever, malaise/fatigue and weight loss.  HENT: Negative.  Negative for nosebleeds.   Eyes: Negative.  Negative for blurred vision, double vision and photophobia.  Respiratory: Positive for shortness of breath. Negative for cough.   Cardiovascular: Positive for orthopnea and leg swelling. Negative for chest pain and palpitations.  Gastrointestinal: Negative.  Negative for heartburn, nausea and vomiting.  Musculoskeletal: Negative.  Negative for myalgias.  Neurological: Negative.  Negative for dizziness, focal weakness, seizures and headaches.  Psychiatric/Behavioral: Negative.  Negative for suicidal ideas.    Past Medical History:  Diagnosis Date  . Hypertension   . Obesity     History reviewed. No pertinent surgical history.  History reviewed. No pertinent family history.  Social History Reviewed with no changes to be made today.   Outpatient Medications Prior to Visit  Medication Sig Dispense Refill  . acetaminophen (TYLENOL) 325 MG tablet Take 650 mg by mouth every 6 (six) hours as needed for mild pain.     . hydrochlorothiazide (HYDRODIURIL) 25 MG tablet Take 1 tablet (25 mg total) by mouth daily. 30 tablet 0  . amLODipine (NORVASC) 10 MG tablet Take 0.5 tablets (5 mg total) by mouth daily. (Patient not taking: Reported on 05/04/2018) 30 tablet 0  . azithromycin (ZITHROMAX)  250 MG tablet Take 1 tablet (250 mg total) by mouth daily. (Patient not taking: Reported on 05/14/2016) 4 tablet 0  . cyclobenzaprine (FLEXERIL) 5 MG tablet Take 1 tablet (5 mg total) by mouth at bedtime as needed for muscle spasms. 10 tablet 0   No facility-administered medications prior to visit.     No Known Allergies     Objective:    BP (!) 166/81 (BP Location:  Right Arm, Patient Position: Sitting, Cuff Size: Large)   Pulse 79   Temp 97.7 F (36.5 C) (Oral)   Resp 18   Ht 6' (1.829 m)   Wt (!) 368 lb (166.9 kg)   SpO2 96%   BMI 49.91 kg/m  Wt Readings from Last 3 Encounters:  05/04/18 (!) 368 lb (166.9 kg)  09/24/17 300 lb (136.1 kg)  12/15/13 280 lb (127 kg)    Physical Exam  Constitutional: He is oriented to person, place, and time. He appears well-developed and well-nourished. He is cooperative.  HENT:  Head: Normocephalic and atraumatic.  Eyes: EOM are normal.  Neck: Trachea normal and normal range of motion. No JVD present. No tracheal deviation present.  Cardiovascular: Normal rate, regular rhythm and normal heart sounds. Exam reveals no gallop and no friction rub.  No murmur heard. Pulmonary/Chest: Effort normal and breath sounds normal. No stridor. Tachypnea noted. No respiratory distress. He has no decreased breath sounds. He has no wheezes. He has no rhonchi. He has no rales. He exhibits no tenderness.  Visibly short of breath while attempting to speak in complete sentences.   Abdominal: Bowel sounds are normal.  Musculoskeletal: Normal range of motion. He exhibits edema. He exhibits no tenderness or deformity.  3+ bilateral pitting edema BLE  Neurological: He is alert and oriented to person, place, and time. Coordination normal.  Skin: Skin is warm and dry.  Psychiatric: He has a normal mood and affect. His behavior is normal. Judgment and thought content normal.  Nursing note and vitals reviewed.        Patient has been counseled extensively about nutrition and exercise as well as the importance of adherence with medications and regular follow-up. The patient was given clear instructions to go to ER or return to medical center if symptoms don't improve, worsen or new problems develop. The patient verbalized understanding.   Follow-up: Return in about 1 month (around 06/01/2018) for Needs appointment with financial  representative.Claiborne Rigg, FNP-BC [facility] and [facility] [place], [place] [phone]   05/04/2018, 9:39 PM

## 2018-05-04 NOTE — Patient Instructions (Signed)
Pulmonary Edema Pulmonary edema is abnormal fluid buildup in the lungs that can make it hard to breathe. Follow these instructions at home:  Talk to your doctor about an exercise program.  Eat a healthy diet: ? Eat fresh fruits, vegetables, and lean meats. ? Limit high fat and salty foods. ? Avoid processed, canned, or fried foods. ? Avoid fast food.  Follow your doctor's advice about taking medicine and recording the medicine you take.  Follow your doctor's advice about keeping a record of your weight.  Talk to your doctor about keeping track of your blood pressure.  Do not smoke.  Do not use nicotine patches or nicotine gum.  Make a follow-up appointment with your doctor.  Ask your doctor for a copy of your latest heart tracing (ECG) and keep a copy with you at all times. Get help right away if:  You have chest pain. THIS IS AN EMERGENCY. Do not wait to see if the pain will go away. Call for local emergency medical help. Do not drive yourself to the hospital.  You have sweating, feel sick to your stomach (nauseous), or are experiencing shortness of breath.  Your weight increases more than your doctor tells you it should.  You start to have shortness of breath.  You notice more swelling in your hands, feet, ankles, or belly.  You have dizziness, blurred vision, headache, or unsteadiness that does not go away.  You cough up bloody spit.  You have a cough that does not go away.  You are unable to sleep because it is hard to breathe.  You begin to feel a "jumping" or "fluttering" sensation (palpitations) in the chest that is unusual for you. This information is not intended to replace advice given to you by your health care provider. Make sure you discuss any questions you have with your health care provider. Document Released: 12/02/2009 Document Revised: 05/21/2016 Document Reviewed: 08/21/2013 Elsevier Interactive Patient Education  2018 [employer]  Heart  Failure Heart failure means your heart has trouble pumping blood. This makes it hard for your body to work well. Heart failure is usually a long-term (chronic) condition. You must take good care of yourself and follow your doctor's treatment plan. Follow these instructions at home:  Take your heart medicine as told by your doctor. ? Do not stop taking medicine unless your doctor tells you to. ? Do not skip any dose of medicine. ? Refill your medicines before they run out. ? Take other medicines only as told by your doctor or pharmacist.  Stay active if told by your doctor. The elderly and people with severe heart failure should talk with a doctor about physical activity.  Eat heart-healthy foods. Choose foods that are without trans fat and are low in saturated fat, cholesterol, and salt (sodium). This includes fresh or frozen fruits and vegetables, fish, lean meats, fat-free or low-fat dairy foods, whole grains, and high-fiber foods. Lentils and dried peas and beans (legumes) are also good choices.  Limit salt if told by your doctor.  Cook in a healthy way. Roast, grill, broil, bake, poach, steam, or stir-fry foods.  Limit fluids as told by your doctor.  Weigh yourself every morning. Do this after you pee (urinate) and before you eat breakfast. Write down your weight to give to your doctor.  Take your blood pressure and write it down if your doctor tells you to.  Ask your doctor how to check your pulse. Check your pulse as told.  Lose  weight if told by your doctor.  Stop smoking or chewing tobacco. Do not use gum or patches that help you quit without your doctor's approval.  Schedule and go to doctor visits as told.  Nonpregnant women should have no more than 1 drink a day. Men should have no more than 2 drinks a day. Talk to your doctor about drinking alcohol.  Stop illegal drug use.  Stay current with shots (immunizations).  Manage your health conditions as told by your  doctor.  Learn to manage your stress.  Rest when you are tired.  If it is really hot outside: ? Avoid intense activities. ? Use air conditioning or fans, or get in a cooler place. ? Avoid caffeine and alcohol. ? Wear loose-fitting, lightweight, and light-colored clothing.  If it is really cold outside: ? Avoid intense activities. ? Layer your clothing. ? Wear mittens or gloves, a hat, and a scarf when going outside. ? Avoid alcohol.  Learn about heart failure and get support as needed.  Get help to maintain or improve your quality of life and your ability to care for yourself as needed. Contact a doctor if:  You gain weight quickly.  You are more short of breath than usual.  You cannot do your normal activities.  You tire easily.  You cough more than normal, especially with activity.  You have any or more puffiness (swelling) in areas such as your hands, feet, ankles, or belly (abdomen).  You cannot sleep because it is hard to breathe.  You feel like your heart is beating fast (palpitations).  You get dizzy or light-headed when you stand up. Get help right away if:  You have trouble breathing.  There is a change in mental status, such as becoming less alert or not being able to focus.  You have chest pain or discomfort.  You faint. This information is not intended to replace advice given to you by your health care provider. Make sure you discuss any questions you have with your health care provider. Document Released: 09/22/2008 Document Revised: 05/21/2016 Document Reviewed: 01/30/2013 Elsevier Interactive Patient Education  2017 [employer].

## 2018-05-09 ENCOUNTER — Encounter (HOSPITAL_COMMUNITY): Payer: Self-pay | Admitting: Emergency Medicine

## 2018-05-09 ENCOUNTER — Emergency Department (HOSPITAL_COMMUNITY)
Admission: EM | Admit: 2018-05-09 | Discharge: 2018-05-09 | Disposition: A | Discharge status: Home / Self Care | Payer: Self-pay | Attending: Emergency Medicine | Admitting: Emergency Medicine

## 2018-05-09 DIAGNOSIS — I10 Essential (primary) hypertension: Secondary | ICD-10-CM | POA: Insufficient documentation

## 2018-05-09 DIAGNOSIS — Z79899 Other long term (current) drug therapy: Secondary | ICD-10-CM | POA: Insufficient documentation

## 2018-05-09 DIAGNOSIS — R609 Edema, unspecified: Secondary | ICD-10-CM | POA: Insufficient documentation

## 2018-05-09 LAB — BASIC METABOLIC PANEL
Anion gap: 11 (ref 5–15)
BUN: 13 mg/dL (ref 6–20)
CO2: 30 mmol/L (ref 22–32)
CREATININE: 1.56 mg/dL — AB (ref 0.61–1.24)
Calcium: 10 mg/dL (ref 8.9–10.3)
Chloride: 95 mmol/L — ABNORMAL LOW (ref 101–111)
GFR calc Af Amer: 56 mL/min — ABNORMAL LOW (ref >60)
GFR, EST NON AFRICAN AMERICAN: 48 mL/min — AB (ref >60)
Glucose, Bld: 141 mg/dL — ABNORMAL HIGH (ref 65–99)
POTASSIUM: 3.5 mmol/L (ref 3.5–5.1)
SODIUM: 136 mmol/L (ref 135–145)

## 2018-05-09 LAB — CBC
HCT: 44.4 % (ref 39.0–52.0)
Hemoglobin: 14.4 g/dL (ref 13.0–17.0)
MCH: 24 pg — AB (ref 26.0–34.0)
MCHC: 32.4 g/dL (ref 30.0–36.0)
MCV: 74.1 fL — ABNORMAL LOW (ref 78.0–100.0)
PLATELETS: 183 10*3/uL (ref 150–400)
RBC: 5.99 MIL/uL — AB (ref 4.22–5.81)
RDW: 16.2 % — ABNORMAL HIGH (ref 11.5–15.5)
WBC: 7.3 10*3/uL (ref 4.0–10.5)

## 2018-05-09 NOTE — ED Triage Notes (Signed)
Reports having swelling in right foot.  Was seen at [facility] family medicine on Thursday and was told he needed to come here for admission.  Wasn't able to come due to work schedule.  Denies any SOB today.  Only has swelling to right ankle/foot.  Reports he needs clearance to go back to work.

## 2018-05-09 NOTE — ED Provider Notes (Signed)
[facility] EMERGENCY DEPARTMENT Provider Note   CSN: [id number] Arrival date & time: 05/09/18  1478     History   Chief Complaint Chief Complaint  Patient presents with  . Leg Swelling    HPI [name] is a 57 y.o. male.  57 year old male with prior history of hypertension, obesity, and CHF presents with complaint of swelling to lower extremities.  Patient was seen for same late last week.  His doctor at that time refilled his antihypertensive and his hydrochlorothiazide prescription.  Patient was told at that time that he should come to the ED for evaluation.  The patient put that off until today.  He now feels improved.  He reports less edema to his lower extremities.  He denies any chest pain or shortness of breath.  The history is provided by the patient.  Illness  This is a recurrent problem. The current episode started more than 2 days ago. The problem occurs daily. The problem has been gradually improving. Pertinent negatives include no chest pain, no abdominal pain, no headaches and no shortness of breath. Nothing aggravates the symptoms. Nothing relieves the symptoms. He has tried nothing for the symptoms. The treatment provided mild relief.    Past Medical History:  Diagnosis Date  . Hypertension   . Obesity     Patient Active Problem List   Diagnosis Date Noted  . Acute pulmonary edema (HCC) 05/04/2018    History reviewed. No pertinent surgical history.      Home Medications    Prior to Admission medications   Medication Sig Start Date End Date Taking? Authorizing Provider  acetaminophen (TYLENOL) 325 MG tablet Take 650 mg by mouth every 6 (six) hours as needed for mild pain.     [provider]  amLODipine (NORVASC) 10 MG tablet Take 1 tablet (10 mg total) by mouth daily. 05/04/18   Claiborne Rigg, NP  hydrochlorothiazide (HYDRODIURIL) 25 MG tablet Take 1 tablet (25 mg total) by mouth daily. 05/04/18   Claiborne Rigg, NP      Family History No family history on file.  Social History Social History   Tobacco Use  . Smoking status: Never Smoker  . Smokeless tobacco: Never Used  Substance Use Topics  . Alcohol use: Yes    Comment: beer  . Drug use: No     Allergies   Patient has no known allergies.   Review of Systems Review of Systems  Respiratory: Negative for shortness of breath.   Cardiovascular: Negative for chest pain.  Gastrointestinal: Negative for abdominal pain.  Neurological: Negative for headaches.  All other systems reviewed and are negative.    Physical Exam Updated Vital Signs BP 121/70   Pulse 64   Temp 97.9 F (36.6 C) (Oral)   Resp 17   Ht 6' (1.829 m)   Wt (!) 165.6 kg (365 lb)   SpO2 92%   BMI 49.50 kg/m   Physical Exam  Constitutional: He is oriented to person, place, and time. He appears well-developed and well-nourished. No distress.  HENT:  Head: Normocephalic and atraumatic.  Mouth/Throat: Oropharynx is clear and moist.  Eyes: Pupils are equal, round, and reactive to light. Conjunctivae and EOM are normal.  Neck: Normal range of motion. Neck supple.  Cardiovascular: Normal rate, regular rhythm and normal heart sounds.  Pulmonary/Chest: Effort normal and breath sounds normal. No respiratory distress.  Abdominal: Soft. He exhibits no distension. There is no tenderness.  Musculoskeletal: Normal range of  motion. He exhibits edema. He exhibits no deformity.  1 plus edema to BLE   Neurological: He is alert and oriented to person, place, and time.  Skin: Skin is warm and dry.  Psychiatric: He has a normal mood and affect.  Nursing note and vitals reviewed.    ED Treatments / Results  Labs (all labs ordered are listed, but only abnormal results are displayed) Labs Reviewed  CBC - Abnormal; Notable for the following components:      Result Value   RBC 5.99 (*)    MCV 74.1 (*)    MCH 24.0 (*)    RDW 16.2 (*)    All other components within normal  limits  BASIC METABOLIC PANEL - Abnormal; Notable for the following components:   Chloride 95 (*)    Glucose, Bld 141 (*)    Creatinine, Ser 1.56 (*)    GFR calc non Af Amer 48 (*)    GFR calc Af Amer 56 (*)    All other components within normal limits    EKG None  Radiology No results found.  Procedures Procedures (including critical care time)  Medications Ordered in ED Medications - No data to display   Initial Impression / Assessment and Plan / ED Course  I have reviewed the triage vital signs and the nursing notes.  Pertinent labs & imaging results that were available during my care of the patient were reviewed by me and considered in my medical decision making (see chart for details).     MDM  Patient is presenting for an evaluation of his lower extremity edema.  This is a chronic condition for him.  He reports that his symptoms have gotten worse after he ran out of his medications 3 weeks ago.  He was given refills on his hydrochlorothiazide and amlodipine last week.  Since restarting his medications, his symptoms have improved.  He denies any current chest pain or shortness of breath.  His clinical picture is not consistent with ACS or a CHF exacerbation.  Screening labs did not reveal any acute abnormalities.  Patient feels significantly improved following his ED evaluation.  He desires discharge home.  He is aware of need for close follow-up.  Strict return precautions were given and understood.  Final Clinical Impressions(s) / ED Diagnoses   Final diagnoses:  Peripheral edema    ED Discharge Orders    None       Wynetta Fines, MD 05/09/18 1416

## 2018-05-09 NOTE — ED Notes (Signed)
Pt stable, ambulatory, states understanding of discharge instructions 

## 2018-05-09 NOTE — Discharge Instructions (Addendum)
Please return for any problem.  Close follow-up with your regular care provider is imperative.

## 2018-06-07 ENCOUNTER — Encounter (INDEPENDENT_AMBULATORY_CARE_PROVIDER_SITE_OTHER): Payer: Self-pay | Admitting: Physician Assistant

## 2018-06-07 ENCOUNTER — Other Ambulatory Visit: Payer: Self-pay

## 2018-06-07 ENCOUNTER — Ambulatory Visit (INDEPENDENT_AMBULATORY_CARE_PROVIDER_SITE_OTHER): Payer: Self-pay | Admitting: Physician Assistant

## 2018-06-07 VITALS — BP 142/87 | HR 69 | Temp 97.9°F | Ht 72.0 in | Wt 357.0 lb

## 2018-06-07 DIAGNOSIS — R609 Edema, unspecified: Secondary | ICD-10-CM

## 2018-06-07 DIAGNOSIS — I10 Essential (primary) hypertension: Secondary | ICD-10-CM

## 2018-06-07 DIAGNOSIS — Z131 Encounter for screening for diabetes mellitus: Secondary | ICD-10-CM

## 2018-06-07 DIAGNOSIS — Z1211 Encounter for screening for malignant neoplasm of colon: Secondary | ICD-10-CM

## 2018-06-07 DIAGNOSIS — R400 Somnolence: Secondary | ICD-10-CM

## 2018-06-07 DIAGNOSIS — Z1159 Encounter for screening for other viral diseases: Secondary | ICD-10-CM

## 2018-06-07 DIAGNOSIS — R7303 Prediabetes: Secondary | ICD-10-CM

## 2018-06-07 DIAGNOSIS — Z114 Encounter for screening for human immunodeficiency virus [HIV]: Secondary | ICD-10-CM

## 2018-06-07 LAB — POCT GLYCOSYLATED HEMOGLOBIN (HGB A1C): Hemoglobin A1C: 6.2 % — AB (ref 4.0–5.6)

## 2018-06-07 MED ORDER — SPIRONOLACTONE 25 MG PO TABS: 12.5000 mg | ORAL_TABLET | Freq: Every day | ORAL | 11 refills | Status: DC

## 2018-06-07 MED ORDER — METFORMIN HCL ER 500 MG PO TB24: 500.0000 mg | ORAL_TABLET | Freq: Every day | ORAL | 3 refills | Status: DC

## 2018-06-07 NOTE — Progress Notes (Signed)
Subjective:  Patient ID: [name], male    DOB: November 19, 1961  Age: 57 y.o. MRN: [id number]  CC: HTN  HPI [name] is a 57 y.o. male with a medical history of HTN, peripheral edema, and obesity presents on f/u for HTN. Was prescribed Amlodipine 10 mg and HCTZ 25 mg one month ago. BP was documented at 122/72 mmHg during an ED visit for peripheral edema three weeks ago. BP elevated today at 164/93 mmHg initially and at 142/76 mmHg on recheck. Has lost 11 lbs since his last visit one month ago. States he feels well and has noticed significant reduction in lower extremity edema. BNP 27.6 pg/mL approximately 9 months ago. Takes medications as directed and denies CP, palpitations, SOB, HA, abdominal pain, tingling, numbness, rash, or GI/GU sxs.       Outpatient Medications Prior to Visit  Medication Sig Dispense Refill  . acetaminophen (TYLENOL) 325 MG tablet Take 650 mg by mouth every 6 (six) hours as needed for mild pain.     Marland Kitchen amLODipine (NORVASC) 10 MG tablet Take 1 tablet (10 mg total) by mouth daily. 90 tablet 1  . hydrochlorothiazide (HYDRODIURIL) 25 MG tablet Take 1 tablet (25 mg total) by mouth daily. 90 tablet 0   No facility-administered medications prior to visit.      ROS Review of Systems  Constitutional: Negative for chills, fever and malaise/fatigue.  Eyes: Negative for blurred vision.  Respiratory: Negative for shortness of breath.   Cardiovascular: Negative for chest pain and palpitations.  Gastrointestinal: Negative for abdominal pain and nausea.  Genitourinary: Negative for dysuria and hematuria.  Musculoskeletal: Negative for joint pain and myalgias.  Skin: Negative for rash.  Neurological: Negative for tingling and headaches.  Psychiatric/Behavioral: Negative for depression. The patient is not nervous/anxious.     Objective:  BP (!) 142/87 (BP Location: Left Arm, Patient Position: Sitting, Cuff Size: Large)   Pulse 69   Temp 97.9 F (36.6 C)  (Oral)   Ht 6' (1.829 m)   Wt (!) 357 lb (161.9 kg)   SpO2 92%   BMI 48.42 kg/m   BP/Weight 06/07/2018 05/09/2018 05/04/2018  Systolic BP 142 122 166  Diastolic BP 87 72 81  Wt. (Lbs) 357 365 368  BMI 48.42 49.5 49.91      Physical Exam  Constitutional: He is oriented to person, place, and time.  Well developed, obese, NAD, polite  HENT:  Head: Normocephalic and atraumatic.  Eyes: No scleral icterus.  Neck: Normal range of motion. Neck supple. No thyromegaly present.  Cardiovascular: Normal rate, regular rhythm and normal heart sounds. Exam reveals no gallop and no friction rub.  No murmur heard. 1+ pitting edema of lower extremity bilaterally  Pulmonary/Chest: Effort normal and breath sounds normal.  Neurological: He is alert and oriented to person, place, and time.  Skin: Skin is warm and dry. No rash noted. No erythema. No pallor.  Psychiatric: He has a normal mood and affect. His behavior is normal. Thought content normal.  Vitals reviewed.    Assessment & Plan:    1. Hypertension, unspecified type - Comprehensive metabolic panel; Future - Lipid panel; Future  2. Daytime somnolence - Needs to apply for CAFA before referral is made to sleep center.  3. Peripheral edema - Improving but will need more diaresis - Check potassium level at next visit due to addition of spironolactone.   4. Morbid obesity (HCC) - Multifactorial to include poor diet, lack of exercise, and probable OSA  5. Screening for diabetes mellitus - HgB A1c 6.2%  6. Need for hepatitis C screening test - Hepatitis c antibody (reflex); Future  7. Screening for HIV (human immunodeficiency virus) - HIV antibody; Future  8. Prediabetes - A1c 6.2% - Counseled patient on diet, exercise, and taking metformin. - Begin metFORMIN (GLUCOPHAGE XR) 500 MG 24 hr tablet; Take 1 tablet (500 mg total) by mouth daily with breakfast.  Dispense: 90 tablet; Refill: 3  9. Special screening for malignant  neoplasms, colon - FIT test given to patient. I personally instructed patient on how to collect and turn in.    Meds ordered this encounter  Medications  . spironolactone (ALDACTONE) 25 MG tablet    Sig: Take 0.5 tablets (12.5 mg total) by mouth daily.    Dispense:  30 tablet    Refill:  11    Order Specific Question:   Supervising Provider    Answer:   Hoy RegisterNEWLIN, ENOBONG [[id number]]  . metFORMIN (GLUCOPHAGE XR) 500 MG 24 hr tablet    Sig: Take 1 tablet (500 mg total) by mouth daily with breakfast.    Dispense:  90 tablet    Refill:  3    Order Specific Question:   Supervising Provider    Answer:   Hoy RegisterNEWLIN, ENOBONG [[id number]]    Follow-up: Return in about 1 month (around 07/05/2018) for HTN.   Loletta Specteroger David Caryssa Elzey PA

## 2018-06-07 NOTE — Patient Instructions (Signed)

## 2018-07-05 ENCOUNTER — Encounter (INDEPENDENT_AMBULATORY_CARE_PROVIDER_SITE_OTHER): Payer: Self-pay

## 2018-07-05 ENCOUNTER — Ambulatory Visit (INDEPENDENT_AMBULATORY_CARE_PROVIDER_SITE_OTHER): Payer: Self-pay | Admitting: Physician Assistant

## 2018-07-05 ENCOUNTER — Encounter (INDEPENDENT_AMBULATORY_CARE_PROVIDER_SITE_OTHER): Payer: Self-pay | Admitting: Physician Assistant

## 2018-07-05 VITALS — BP 126/62 | HR 55 | Temp 98.0°F | Ht 72.0 in | Wt 359.8 lb

## 2018-07-05 DIAGNOSIS — Z5321 Procedure and treatment not carried out due to patient leaving prior to being seen by health care provider: Secondary | ICD-10-CM

## 2018-07-05 DIAGNOSIS — Z760 Encounter for issue of repeat prescription: Secondary | ICD-10-CM

## 2018-07-05 MED ORDER — METFORMIN HCL ER 500 MG PO TB24: 500.0000 mg | ORAL_TABLET | Freq: Every day | ORAL | 1 refills | Status: DC

## 2018-07-05 MED ORDER — HYDROCHLOROTHIAZIDE 25 MG PO TABS: 25.0000 mg | ORAL_TABLET | Freq: Every day | ORAL | 1 refills | Status: DC

## 2018-07-05 MED ORDER — SPIRONOLACTONE 25 MG PO TABS: 12.5000 mg | ORAL_TABLET | Freq: Every day | ORAL | 1 refills | Status: DC

## 2018-07-05 MED ORDER — AMLODIPINE BESYLATE 10 MG PO TABS
10.0000 mg | ORAL_TABLET | Freq: Every day | ORAL | 1 refills | Status: DC
Start: 2018-07-05 — End: 2019-07-24

## 2018-07-05 NOTE — Progress Notes (Signed)
   Subjective:  Patient ID: [name], male    DOB: 07/17/1961  Age: 57 y.o. MRN: [id number]  CC: HTN f/u  HPI [name] is a 57 y.o. male with a medical history of HTN, peripheral edema, and obesity presents for HTN f/u. BP 142/87 mmHg one month ago. BP 126/62 mmHg today. Taking medications as directed. Feeling well generally. No complaints or symptoms.       Outpatient Medications Prior to Visit  Medication Sig Dispense Refill  . acetaminophen (TYLENOL) 325 MG tablet Take 650 mg by mouth every 6 (six) hours as needed for mild pain.     Marland Kitchen. amLODipine (NORVASC) 10 MG tablet Take 1 tablet (10 mg total) by mouth daily. 90 tablet 1  . hydrochlorothiazide (HYDRODIURIL) 25 MG tablet Take 1 tablet (25 mg total) by mouth daily. 90 tablet 0  . metFORMIN (GLUCOPHAGE XR) 500 MG 24 hr tablet Take 1 tablet (500 mg total) by mouth daily with breakfast. 90 tablet 3  . spironolactone (ALDACTONE) 25 MG tablet Take 0.5 tablets (12.5 mg total) by mouth daily. 30 tablet 11   No facility-administered medications prior to visit.      ROS ROS  Objective:  BP 126/62 (BP Location: Left Arm, Patient Position: Sitting, Cuff Size: Large)   Pulse (!) 55   Temp 98 F (36.7 C) (Oral)   Ht 6' (1.829 m)   Wt (!) 359 lb 12.8 oz (163.2 kg)   SpO2 93%   BMI 48.80 kg/m   BP/Weight 07/05/2018 06/07/2018 05/09/2018  Systolic BP 126 142 122  Diastolic BP 62 87 72  Wt. (Lbs) 359.8 357 365  BMI 48.8 48.42 49.5      Physical Exam   Assessment & Plan:    1. Patient left without being seen - Pt had BP taken but decided to leave shortly before provider entered room. Brief HPI had been obtained by CMA TR. - All medications refilled  2. Medication refill - all meds refilled   Meds ordered this encounter  Medications  . hydrochlorothiazide (HYDRODIURIL) 25 MG tablet    Sig: Take 1 tablet (25 mg total) by mouth daily.    Dispense:  90 tablet    Refill:  1    Order Specific Question:    Supervising Provider    Answer:   Hoy RegisterNEWLIN, ENOBONG [[id number]]  . amLODipine (NORVASC) 10 MG tablet    Sig: Take 1 tablet (10 mg total) by mouth daily.    Dispense:  90 tablet    Refill:  1    Order Specific Question:   Supervising Provider    Answer:   Hoy RegisterNEWLIN, ENOBONG [[id number]]  . metFORMIN (GLUCOPHAGE XR) 500 MG 24 hr tablet    Sig: Take 1 tablet (500 mg total) by mouth daily with breakfast.    Dispense:  90 tablet    Refill:  1    Order Specific Question:   Supervising Provider    Answer:   Hoy RegisterNEWLIN, ENOBONG [[id number]]  . spironolactone (ALDACTONE) 25 MG tablet    Sig: Take 0.5 tablets (12.5 mg total) by mouth daily.    Dispense:  90 tablet    Refill:  1    Order Specific Question:   Supervising Provider    Answer:   Hoy RegisterNEWLIN, ENOBONG [[id number]]    Follow-up:    Loletta Specteroger David Larin Depaoli PA

## 2018-07-06 ENCOUNTER — Ambulatory Visit: Payer: Self-pay

## 2018-08-02 ENCOUNTER — Ambulatory Visit (INDEPENDENT_AMBULATORY_CARE_PROVIDER_SITE_OTHER): Payer: Self-pay | Admitting: Physician Assistant

## 2018-09-08 ENCOUNTER — Ambulatory Visit (INDEPENDENT_AMBULATORY_CARE_PROVIDER_SITE_OTHER): Payer: Self-pay | Admitting: Physician Assistant

## 2018-10-14 ENCOUNTER — Ambulatory Visit (INDEPENDENT_AMBULATORY_CARE_PROVIDER_SITE_OTHER): Payer: Self-pay | Admitting: Physician Assistant

## 2018-10-29 ENCOUNTER — Encounter (HOSPITAL_COMMUNITY): Payer: Self-pay

## 2018-10-29 ENCOUNTER — Emergency Department (HOSPITAL_COMMUNITY): Payer: Self-pay

## 2018-10-29 ENCOUNTER — Other Ambulatory Visit: Payer: Self-pay

## 2018-10-29 ENCOUNTER — Emergency Department (HOSPITAL_COMMUNITY)
Admission: EM | Admit: 2018-10-29 | Discharge: 2018-10-29 | Disposition: A | Discharge status: Home / Self Care | Payer: Self-pay | Attending: Emergency Medicine | Admitting: Emergency Medicine

## 2018-10-29 DIAGNOSIS — I10 Essential (primary) hypertension: Secondary | ICD-10-CM | POA: Insufficient documentation

## 2018-10-29 DIAGNOSIS — R600 Localized edema: Secondary | ICD-10-CM | POA: Insufficient documentation

## 2018-10-29 DIAGNOSIS — Z79899 Other long term (current) drug therapy: Secondary | ICD-10-CM | POA: Insufficient documentation

## 2018-10-29 DIAGNOSIS — M79672 Pain in left foot: Secondary | ICD-10-CM | POA: Insufficient documentation

## 2018-10-29 NOTE — ED Provider Notes (Signed)
[facility] EMERGENCY DEPARTMENT Provider Note   CSN: [id number] Arrival date & time: 10/29/18  1012     History   Chief Complaint No chief complaint on file.   HPI [name] is a 57 y.o. male.  57 year old male presents with complaint of pain in his left heel area.  Patient states that he first noticed the pain in his foot when he went to get out of bed and put weight on his feet.  Patient denies falls or injuries, states that when he noticed that he had pain in his foot he looked at his feet and felt like the left foot might be swollen, then realized both feet might look swollen but he is not certain.  He denies pain in his calves, changes in the color of his feet or any wounds to the feet.  Patient works as a Financial risk analyst, states that he was off work yesterday and had an uneventful day around the house.  No other complaints or concerns.     Past Medical History:  Diagnosis Date  . Hypertension   . Obesity     Patient Active Problem List   Diagnosis Date Noted  . Acute pulmonary edema (HCC) 05/04/2018    History reviewed. No pertinent surgical history.      Home Medications    Prior to Admission medications   Medication Sig Start Date End Date Taking? Authorizing Provider  acetaminophen (TYLENOL) 325 MG tablet Take 650 mg by mouth every 6 (six) hours as needed for mild pain.     [provider]  amLODipine (NORVASC) 10 MG tablet Take 1 tablet (10 mg total) by mouth daily. 07/05/18   Loletta Specter, PA-C  hydrochlorothiazide (HYDRODIURIL) 25 MG tablet Take 1 tablet (25 mg total) by mouth daily. 07/05/18   Loletta Specter, PA-C  metFORMIN (GLUCOPHAGE XR) 500 MG 24 hr tablet Take 1 tablet (500 mg total) by mouth daily with breakfast. 07/05/18   Loletta Specter, PA-C  spironolactone (ALDACTONE) 25 MG tablet Take 0.5 tablets (12.5 mg total) by mouth daily. 07/05/18   Loletta Specter, PA-C    Family History No family history on  file.  Social History Social History   Tobacco Use  . Smoking status: Never Smoker  . Smokeless tobacco: Never Used  Substance Use Topics  . Alcohol use: Yes    Comment: beer  . Drug use: No     Allergies   Patient has no known allergies.   Review of Systems Review of Systems  Constitutional: Negative for fever.  Musculoskeletal: Positive for arthralgias and joint swelling. Negative for myalgias.  Skin: Negative for color change, rash and wound.  Neurological: Negative for weakness and numbness.  Hematological: Does not bruise/bleed easily.  Psychiatric/Behavioral: Negative for self-injury.  All other systems reviewed and are negative.    Physical Exam Updated Vital Signs BP (!) 144/72 (BP Location: Right Arm)   Pulse 88   Temp 99.8 F (37.7 C) (Oral)   Resp 18   SpO2 97%   Physical Exam  Constitutional: He is oriented to person, place, and time. He appears well-developed and well-nourished. No distress.  HENT:  Head: Normocephalic and atraumatic.  Cardiovascular: Intact distal pulses.  Pulmonary/Chest: Effort normal.  Musculoskeletal: Normal range of motion. He exhibits tenderness. He exhibits no deformity.       Left foot: There is tenderness, bony tenderness and swelling. There is normal range of motion, normal capillary refill, no crepitus, no deformity  and no laceration.       Feet:  Neurological: He is alert and oriented to person, place, and time. No sensory deficit.  Skin: Skin is warm and dry. Capillary refill takes less than 2 seconds. No rash noted. He is not diaphoretic. No erythema. No pallor.  Psychiatric: He has a normal mood and affect. His behavior is normal.  Nursing note and vitals reviewed.    ED Treatments / Results  Labs (all labs ordered are listed, but only abnormal results are displayed) Labs Reviewed - No data to display  EKG None  Radiology Dg Foot Complete Left  Result Date: 10/29/2018 CLINICAL DATA:  Left foot pain and  swelling without known injury. EXAM: LEFT FOOT - COMPLETE 3+ VIEW COMPARISON:  None. FINDINGS: There is no evidence of fracture or dislocation. There is no evidence of arthropathy or other focal bone abnormality. Mild diffuse soft tissue swelling. IMPRESSION: No acute fracture or dislocation identified about the left foot. Electronically Signed   By: Ted Mcalpine M.D.   On: 10/29/2018 11:04    Procedures Procedures (including critical care time)  Medications Ordered in ED Medications - No data to display   Initial Impression / Assessment and Plan / ED Course  I have reviewed the triage vital signs and the nursing notes.  Pertinent labs & imaging results that were available during my care of the patient were reviewed by me and considered in my medical decision making (see chart for details).  Clinical Course as of Oct 29 1242  Sat Oct 29, 2018  [id number] 57 year old male with complaint of pain in his left heel without injury.  Patient first noticed pain when he got out of bed and put weight on his foot today.  He has tenderness in his left calcaneus.  Pain not noted to be through the arch of the foot.  X-ray shows soft tissue swelling, no acute bony abnormality.  Patient placed in postop shoe, recommend recheck with PCP.   [LM]    Clinical Course User Index [LM] Jeannie Fend, PA-C   Final Clinical Impressions(s) / ED Diagnoses   Final diagnoses:  Left foot pain    ED Discharge Orders    None       Alden Hipp 10/29/18 1243    Tilden Fossa, MD 10/30/18 [phone]

## 2018-10-29 NOTE — ED Triage Notes (Signed)
Patient complains of awakening this am and noticing that his ankles felt tight, no trauma, no pain, no distress. States that he does a lot of walking at work.

## 2018-10-29 NOTE — Discharge Instructions (Addendum)
Rest today, elevate feet above the level of the heart to help reduce any swelling.  Be sure to drink plenty of water.  Limit salt intake. Up with your primary care provider next week.

## 2018-12-06 ENCOUNTER — Ambulatory Visit (INDEPENDENT_AMBULATORY_CARE_PROVIDER_SITE_OTHER): Payer: Self-pay | Admitting: Physician Assistant

## 2018-12-23 ENCOUNTER — Ambulatory Visit (INDEPENDENT_AMBULATORY_CARE_PROVIDER_SITE_OTHER): Payer: Self-pay | Admitting: Physician Assistant

## 2019-01-17 ENCOUNTER — Ambulatory Visit (INDEPENDENT_AMBULATORY_CARE_PROVIDER_SITE_OTHER): Payer: Self-pay | Admitting: Family Medicine

## 2019-03-08 ENCOUNTER — Ambulatory Visit (INDEPENDENT_AMBULATORY_CARE_PROVIDER_SITE_OTHER): Payer: Self-pay | Admitting: Primary Care

## 2019-04-20 ENCOUNTER — Other Ambulatory Visit: Payer: Self-pay

## 2019-04-20 ENCOUNTER — Emergency Department (HOSPITAL_COMMUNITY)
Admission: EM | Admit: 2019-04-20 | Discharge: 2019-04-20 | Disposition: A | Discharge status: Home / Self Care | Payer: Self-pay | Attending: Emergency Medicine | Admitting: Emergency Medicine

## 2019-04-20 ENCOUNTER — Encounter (HOSPITAL_COMMUNITY): Payer: Self-pay | Admitting: Emergency Medicine

## 2019-04-20 ENCOUNTER — Emergency Department (HOSPITAL_COMMUNITY): Payer: Self-pay

## 2019-04-20 ENCOUNTER — Ambulatory Visit: Payer: Self-pay | Attending: Primary Care | Admitting: Primary Care

## 2019-04-20 DIAGNOSIS — Z86711 Personal history of pulmonary embolism: Secondary | ICD-10-CM | POA: Insufficient documentation

## 2019-04-20 DIAGNOSIS — E669 Obesity, unspecified: Secondary | ICD-10-CM | POA: Insufficient documentation

## 2019-04-20 DIAGNOSIS — I10 Essential (primary) hypertension: Secondary | ICD-10-CM | POA: Insufficient documentation

## 2019-04-20 DIAGNOSIS — M25561 Pain in right knee: Secondary | ICD-10-CM | POA: Insufficient documentation

## 2019-04-20 MED ORDER — NAPROXEN 500 MG PO TABS: 500.0000 mg | ORAL_TABLET | Freq: Two times a day (BID) | ORAL | 0 refills | Status: DC

## 2019-04-20 NOTE — Discharge Instructions (Signed)
You have arthritis in your knee. Please follow up with your regular doctor for further treatment. Thank you for allowing me to care for you today. Please return to the emergency department if you have new or worsening symptoms. Take your medications as instructed.

## 2019-04-20 NOTE — ED Provider Notes (Signed)
[facility]   CSN: [id number] Arrival date & time: 04/20/19  1614    History   Chief Complaint Chief Complaint  Patient presents with   Knee Pain    HPI [name] is a 58 y.o. male.     58 y/o male with PMH obesity, HTN presents to the ED for one month of lateral R knee pain. Denies injury or trauma. He Is still able to ambulate. Has not tried anything for relief. Had a doctors appopintment for it today with PMD but missed his appointment so came to the ED.      Past Medical History:  Diagnosis Date   Hypertension    Obesity     Patient Active Problem List   Diagnosis Date Noted   Acute pulmonary edema (HCC) 05/04/2018    History reviewed. No pertinent surgical history.      Home Medications    Prior to Admission medications   Medication Sig Start Date End Date Taking? Authorizing Provider  amLODipine (NORVASC) 10 MG tablet Take 1 tablet (10 mg total) by mouth daily. Patient not taking: Reported on 04/20/2019 07/05/18   Loletta SpecterGomez, Roger David, PA-C  hydrochlorothiazide (HYDRODIURIL) 25 MG tablet Take 1 tablet (25 mg total) by mouth daily. Patient not taking: Reported on 04/20/2019 07/05/18   Loletta SpecterGomez, Roger David, PA-C  metFORMIN (GLUCOPHAGE XR) 500 MG 24 hr tablet Take 1 tablet (500 mg total) by mouth daily with breakfast. Patient not taking: Reported on 04/20/2019 07/05/18   Loletta SpecterGomez, Roger David, PA-C  naproxen (NAPROSYN) 500 MG tablet Take 1 tablet (500 mg total) by mouth 2 (two) times daily. 04/20/19   Arlyn DunningMcLean, Jisselle Poth A, PA-C  spironolactone (ALDACTONE) 25 MG tablet Take 0.5 tablets (12.5 mg total) by mouth daily. Patient not taking: Reported on 04/20/2019 07/05/18   Loletta SpecterGomez, Roger David, PA-C    Family History No family history on file.  Social History Social History   Tobacco Use   Smoking status: Never Smoker   Smokeless tobacco: Never Used  Substance Use Topics   Alcohol use: Yes    Comment: beer    Drug use: No     Allergies   Patient has no known allergies.   Review of Systems Review of Systems  Constitutional: Negative for chills and fever.  HENT: Negative for ear pain and sore throat.   Eyes: Negative for pain and visual disturbance.  Respiratory: Negative for cough and shortness of breath.   Cardiovascular: Negative for chest pain and palpitations.  Gastrointestinal: Negative for abdominal pain and vomiting.  Genitourinary: Negative for dysuria and hematuria.  Musculoskeletal: Positive for arthralgias. Negative for back pain, gait problem, joint swelling, myalgias, neck pain and neck stiffness.  Skin: Negative for color change and rash.  Neurological: Negative for seizures and syncope.  All other systems reviewed and are negative.    Physical Exam Updated Vital Signs BP (!) 158/86 (BP Location: Right Arm)    Pulse (!) 55    Temp 98.2 F (36.8 C) (Oral)    Resp 16    SpO2 97%   Physical Exam Vitals signs and nursing note reviewed.  Constitutional:      Appearance: Normal appearance. He is obese.  HENT:     Head: Normocephalic.  Eyes:     Conjunctiva/sclera: Conjunctivae normal.  Pulmonary:     Effort: Pulmonary effort is normal.  Musculoskeletal:     Comments: Exam limited due to body habitus. Lateral R joint line tenderness.  No deformity or obvious laxity.   Skin:    General: Skin is dry.  Neurological:     Mental Status: He is alert.  Psychiatric:        Mood and Affect: Mood normal.      ED Treatments / Results  Labs (all labs ordered are listed, but only abnormal results are displayed) Labs Reviewed - No data to display  EKG None  Radiology Dg Knee 2 Views Right  Result Date: 04/20/2019 CLINICAL DATA:  Right knee pain. Pain along the medial side of the knee. EXAM: RIGHT KNEE - 1-2 VIEW COMPARISON:  None. FINDINGS: Right knee is located without a fracture, dislocation or large joint effusion. Mild spurring in the patellofemoral compartment of  the knee. Enthesopathic changes along the anterior aspect of the patella. Minimal spurring in the medial knee compartment. IMPRESSION: 1. No acute bone abnormality to the right knee. 2. Mild degenerative changes in the right knee. Electronically Signed   By: Richarda Overlie M.D.   On: 04/20/2019 16:52    Procedures Procedures (including critical care time)  Medications Ordered in ED Medications - No data to display   Initial Impression / Assessment and Plan / ED Course  I have reviewed the triage vital signs and the nursing notes.  Pertinent labs & imaging results that were available during my care of the patient were reviewed by me and considered in my medical decision making (see chart for details).        Based on review of vitals, medical screening exam, lab work and/or imaging, there does not appear to be an acute, emergent etiology for the patient's symptoms. Counseled pt on good return precautions and encouraged both PCP and ED follow-up as needed.  Prior to discharge, I also discussed incidental imaging findings with patient in detail and advised appropriate, recommended follow-up in detail.  Clinical Impression: 1. Arthralgia of right knee     Disposition: Discharge   This note was prepared with assistance of Dragon voice recognition software. Occasional wrong-word or sound-a-like substitutions may have occurred due to the inherent limitations of voice recognition software.   Final Clinical Impressions(s) / ED Diagnoses   Final diagnoses:  Arthralgia of right knee    ED Discharge Orders         Ordered    naproxen (NAPROSYN) 500 MG tablet  2 times daily     04/20/19 1729           Jeral Pinch 04/20/19 1729    Mancel Bale, MD 04/21/19 [phone]

## 2019-04-20 NOTE — ED Triage Notes (Signed)
Rt knee pain  X 1 month no injury just started hurtring worse today had a dr appointment today but couldn't get there. So he came here Has not taken any meds for it

## 2019-07-03 ENCOUNTER — Encounter (INDEPENDENT_AMBULATORY_CARE_PROVIDER_SITE_OTHER): Payer: Self-pay | Admitting: Primary Care

## 2019-07-03 ENCOUNTER — Ambulatory Visit (INDEPENDENT_AMBULATORY_CARE_PROVIDER_SITE_OTHER): Payer: Self-pay | Admitting: Primary Care

## 2019-07-03 ENCOUNTER — Other Ambulatory Visit: Payer: Self-pay

## 2019-07-03 DIAGNOSIS — G8929 Other chronic pain: Secondary | ICD-10-CM

## 2019-07-03 DIAGNOSIS — R7303 Prediabetes: Secondary | ICD-10-CM

## 2019-07-03 DIAGNOSIS — M25561 Pain in right knee: Secondary | ICD-10-CM

## 2019-07-03 DIAGNOSIS — I10 Essential (primary) hypertension: Secondary | ICD-10-CM

## 2019-07-03 MED ORDER — IBUPROFEN 600 MG PO TABS: 600.0000 mg | ORAL_TABLET | Freq: Three times a day (TID) | ORAL | 0 refills | Status: DC | PRN

## 2019-07-03 NOTE — Progress Notes (Signed)
Virtual Visit via Telephone Note  I connected with [name] on 07/03/19 at  8:30 AM EDT by telephone and verified that I am speaking with the correct person using two identifiers.   I discussed the limitations, risks, security and privacy concerns of performing an evaluation and management service by telephone and the availability of in person appointments. I also discussed with the patient that there may be a patient responsible charge related to this service. The patient expressed understanding and agreed to proceed.   History of Present Illness: [name] is having right knee pain increase pain when trying to put she on because of having to bend it, also trying to walk on it easy but does have unstable gait. Also, has been out of medication for over a month. His follow up appo   Observations/Objective: Review of Systems  Constitutional: Negative.   HENT: Negative.   Cardiovascular: Positive for leg swelling.       Right Knee swelling  Musculoskeletal: Negative.        Knee pain describes as sharp  Skin: Negative.   Endo/Heme/Allergies: Negative.   Psychiatric/Behavioral: Negative.     Assessment and Plan: Keynan was seen today for knee pain.  Diagnoses and all orders for this visit:  Chronic pain of right knee Denies falls seen in ED x-ray showed -No acute bone abnormality to the right knee. Mild degenerative changes in the right.  knee. Treat with NSAIDS   Prediabetes ADA recommends the following therapeutic goals for glycemic control related to A1c measurements: Goal of therapy: Less than 6.5 hemoglobin A1c.  Reference clinical practice recommendations. Foods that are high in carbohydrates are the following rice, potatoes, breads, sugars, and pastas.  Reduction in the intake (eating) lt assist in lowering your blood sugars.  Essential hypertension Has been out of medication states he is able to take readings at home -I am unclear if he is understanding how to take  Bp and read them. Reccommended Bp less than 130/90. Advised him to monitor salt intake.   Follow Up Instructions:    I discussed the assessment and treatment plan with the patient. The patient was provided an opportunity to ask questions and all were answered. The patient agreed with the plan and demonstrated an understanding of the instructions.   The patient was advised to call back or seek an in-person evaluation if the symptoms worsen or if the condition fails to improve as anticipated.  I provided 13 minutes of non-face-to-face time during this encounter.   [name], NP

## 2019-07-03 NOTE — Progress Notes (Signed)
Pt has been out of medications for a while

## 2019-07-24 ENCOUNTER — Other Ambulatory Visit: Payer: Self-pay

## 2019-07-24 ENCOUNTER — Ambulatory Visit (INDEPENDENT_AMBULATORY_CARE_PROVIDER_SITE_OTHER): Payer: Self-pay | Admitting: Primary Care

## 2019-07-24 ENCOUNTER — Encounter (INDEPENDENT_AMBULATORY_CARE_PROVIDER_SITE_OTHER): Payer: Self-pay | Admitting: Primary Care

## 2019-07-24 VITALS — BP 168/92 | HR 58 | Temp 97.3°F | Ht 72.0 in | Wt 344.4 lb

## 2019-07-24 DIAGNOSIS — G8929 Other chronic pain: Secondary | ICD-10-CM

## 2019-07-24 DIAGNOSIS — I10 Essential (primary) hypertension: Secondary | ICD-10-CM

## 2019-07-24 DIAGNOSIS — Z6841 Body Mass Index (BMI) 40.0 and over, adult: Secondary | ICD-10-CM

## 2019-07-24 DIAGNOSIS — Z1211 Encounter for screening for malignant neoplasm of colon: Secondary | ICD-10-CM

## 2019-07-24 DIAGNOSIS — G4733 Obstructive sleep apnea (adult) (pediatric): Secondary | ICD-10-CM

## 2019-07-24 DIAGNOSIS — Z0000 Encounter for general adult medical examination without abnormal findings: Secondary | ICD-10-CM

## 2019-07-24 DIAGNOSIS — R7303 Prediabetes: Secondary | ICD-10-CM

## 2019-07-24 DIAGNOSIS — M25561 Pain in right knee: Secondary | ICD-10-CM

## 2019-07-24 DIAGNOSIS — Z114 Encounter for screening for human immunodeficiency virus [HIV]: Secondary | ICD-10-CM

## 2019-07-24 DIAGNOSIS — Z131 Encounter for screening for diabetes mellitus: Secondary | ICD-10-CM

## 2019-07-24 DIAGNOSIS — Z23 Encounter for immunization: Secondary | ICD-10-CM

## 2019-07-24 DIAGNOSIS — Z760 Encounter for issue of repeat prescription: Secondary | ICD-10-CM

## 2019-07-24 LAB — POCT GLYCOSYLATED HEMOGLOBIN (HGB A1C): Hemoglobin A1C: 5.8 % — AB (ref 4.0–5.6)

## 2019-07-24 MED ORDER — SPIRONOLACTONE 25 MG PO TABS: 12.5000 mg | ORAL_TABLET | Freq: Every day | ORAL | 1 refills | Status: DC

## 2019-07-24 MED ORDER — AMLODIPINE BESYLATE 10 MG PO TABS: 10.0000 mg | ORAL_TABLET | Freq: Every day | ORAL | 1 refills | Status: DC

## 2019-07-24 MED ORDER — HYDROCHLOROTHIAZIDE 25 MG PO TABS: 25.0000 mg | ORAL_TABLET | Freq: Every day | ORAL | 1 refills | Status: DC

## 2019-07-24 NOTE — Patient Instructions (Signed)
Calorie Counting for Weight Loss Calories are units of energy. Your body needs a certain amount of calories from food to keep you going throughout the day. When you eat more calories than your body needs, your body stores the extra calories as fat. When you eat fewer calories than your body needs, your body burns fat to get the energy it needs. Calorie counting means keeping track of how many calories you eat and drink each day. Calorie counting can be helpful if you need to lose weight. If you make sure to eat fewer calories than your body needs, you should lose weight. Ask your health care provider what a healthy weight is for you. For calorie counting to work, you will need to eat the right number of calories in a day in order to lose a healthy amount of weight per week. A dietitian can help you determine how many calories you need in a day and will give you suggestions on how to reach your calorie goal.  A healthy amount of weight to lose per week is usually 1-2 lb (0.5-0.9 kg). This usually means that your daily calorie intake should be reduced by 500-750 calories.  Eating 1,200 - 1,500 calories per day can help most women lose weight.  Eating 1,500 - 1,800 calories per day can help most men lose weight. What is my plan? My goal is to have __________ calories per day. If I have this many calories per day, I should lose around __________ pounds per week. What do I need to know about calorie counting? In order to meet your daily calorie goal, you will need to:  Find out how many calories are in each food you would like to eat. Try to do this before you eat.  Decide how much of the food you plan to eat.  Write down what you ate and how many calories it had. Doing this is called keeping a food log. To successfully lose weight, it is important to balance calorie counting with a healthy lifestyle that includes regular activity. Aim for 150 minutes of moderate exercise (such as walking) or 75  minutes of vigorous exercise (such as running) each week. Where do I find calorie information?  The number of calories in a food can be found on a Nutrition Facts label. If a food does not have a Nutrition Facts label, try to look up the calories online or ask your dietitian for help. Remember that calories are listed per serving. If you choose to have more than one serving of a food, you will have to multiply the calories per serving by the amount of servings you plan to eat. For example, the label on a package of bread might say that a serving size is 1 slice and that there are 90 calories in a serving. If you eat 1 slice, you will have eaten 90 calories. If you eat 2 slices, you will have eaten 180 calories. How do I keep a food log? Immediately after each meal, record the following information in your food log:  What you ate. Don't forget to include toppings, sauces, and other extras on the food.  How much you ate. This can be measured in cups, ounces, or number of items.  How many calories each food and drink had.  The total number of calories in the meal. Keep your food log near you, such as in a small notebook in your pocket, or use a mobile app or website. Some programs will calculate   calories for you and show you how many calories you have left for the day to meet your goal. What are some calorie counting tips?   Use your calories on foods and drinks that will fill you up and not leave you hungry: ? Some examples of foods that fill you up are nuts and nut butters, vegetables, lean proteins, and high-fiber foods like whole grains. High-fiber foods are foods with more than 5 g fiber per serving. ? Drinks such as sodas, specialty coffee drinks, alcohol, and juices have a lot of calories, yet do not fill you up.  Eat nutritious foods and avoid empty calories. Empty calories are calories you get from foods or beverages that do not have many vitamins or protein, such as candy, sweets, and  soda. It is better to have a nutritious high-calorie food (such as an avocado) than a food with few nutrients (such as a bag of chips).  Know how many calories are in the foods you eat most often. This will help you calculate calorie counts faster.  Pay attention to calories in drinks. Low-calorie drinks include water and unsweetened drinks.  Pay attention to nutrition labels for "low fat" or "fat free" foods. These foods sometimes have the same amount of calories or more calories than the full fat versions. They also often have added sugar, starch, or salt, to make up for flavor that was removed with the fat.  Find a way of tracking calories that works for you. Get creative. Try different apps or programs if writing down calories does not work for you. What are some portion control tips?  Know how many calories are in a serving. This will help you know how many servings of a certain food you can have.  Use a measuring cup to measure serving sizes. You could also try weighing out portions on a kitchen scale. With time, you will be able to estimate serving sizes for some foods.  Take some time to put servings of different foods on your favorite plates, bowls, and cups so you know what a serving looks like.  Try not to eat straight from a bag or box. Doing this can lead to overeating. Put the amount you would like to eat in a cup or on a plate to make sure you are eating the right portion.  Use smaller plates, glasses, and bowls to prevent overeating.  Try not to multitask (for example, watch TV or use your computer) while eating. If it is time to eat, sit down at a table and enjoy your food. This will help you to know when you are full. It will also help you to be aware of what you are eating and how much you are eating. What are tips for following this plan? Reading food labels  Check the calorie count compared to the serving size. The serving size may be smaller than what you are used to  eating.  Check the source of the calories. Make sure the food you are eating is high in vitamins and protein and low in saturated and trans fats. Shopping  Read nutrition labels while you shop. This will help you make healthy decisions before you decide to purchase your food.  Make a grocery list and stick to it. Cooking  Try to cook your favorite foods in a healthier way. For example, try baking instead of frying.  Use low-fat dairy products. Meal planning  Use more fruits and vegetables. Half of your plate should be fruits   and vegetables.  Include lean proteins like poultry and fish. How do I count calories when eating out?  Ask for smaller portion sizes.  Consider sharing an entree and sides instead of getting your own entree.  If you get your own entree, eat only half. Ask for a box at the beginning of your meal and put the rest of your entree in it so you are not tempted to eat it.  If calories are listed on the menu, choose the lower calorie options.  Choose dishes that include vegetables, fruits, whole grains, low-fat dairy products, and lean protein.  Choose items that are boiled, broiled, grilled, or steamed. Stay away from items that are buttered, battered, fried, or served with cream sauce. Items labeled "crispy" are usually fried, unless stated otherwise.  Choose water, low-fat milk, unsweetened iced tea, or other drinks without added sugar. If you want an alcoholic beverage, choose a lower calorie option such as a glass of wine or light beer.  Ask for dressings, sauces, and syrups on the side. These are usually high in calories, so you should limit the amount you eat.  If you want a salad, choose a garden salad and ask for grilled meats. Avoid extra toppings like bacon, cheese, or fried items. Ask for the dressing on the side, or ask for olive oil and vinegar or lemon to use as dressing.  Estimate how many servings of a food you are given. For example, a serving of  cooked rice is  cup or about the size of half a baseball. Knowing serving sizes will help you be aware of how much food you are eating at restaurants. The list below tells you how big or small some common portion sizes are based on everyday objects: ? 1 oz-4 stacked dice. ? 3 oz-1 deck of cards. ? 1 tsp-1 die. ? 1 Tbsp- a ping-pong ball. ? 2 Tbsp-1 ping-pong ball. ?  cup- baseball. ? 1 cup-1 baseball. Summary  Calorie counting means keeping track of how many calories you eat and drink each day. If you eat fewer calories than your body needs, you should lose weight.  A healthy amount of weight to lose per week is usually 1-2 lb (0.5-0.9 kg). This usually means reducing your daily calorie intake by 500-750 calories.  The number of calories in a food can be found on a Nutrition Facts label. If a food does not have a Nutrition Facts label, try to look up the calories online or ask your dietitian for help.  Use your calories on foods and drinks that will fill you up, and not on foods and drinks that will leave you hungry.  Use smaller plates, glasses, and bowls to prevent overeating. This information is not intended to replace advice given to you by your health care provider. Make sure you discuss any questions you have with your health care provider. Document Released: 12/14/2005 Document Revised: 09/02/2018 Document Reviewed: 11/13/2016 Elsevier Patient Education  2020 [employer]. Hypertension, Adult Hypertension is another name for high blood pressure. High blood pressure forces your heart to work harder to pump blood. This can cause problems over time. There are two numbers in a blood pressure reading. There is a top number (systolic) over a bottom number (diastolic). It is best to have a blood pressure that is below 120/80. Healthy choices can help lower your blood pressure, or you may need medicine to help lower it. What are the causes? The cause of this condition is not known.  Some  conditions may be related to high blood pressure. What increases the risk?  Smoking.  Having type 2 diabetes mellitus, high cholesterol, or both.  Not getting enough exercise or physical activity.  Being overweight.  Having too much fat, sugar, calories, or salt (sodium) in your diet.  Drinking too much alcohol.  Having long-term (chronic) kidney disease.  Having a family history of high blood pressure.  Age. Risk increases with age.  Race. You may be at higher risk if you are African American.  Gender. Men are at higher risk than women before age 10245. After age 56, women are at higher risk than men.  Having obstructive sleep apnea.  Stress. What are the signs or symptoms?  High blood pressure may not cause symptoms. Very high blood pressure (hypertensive crisis) may cause: ? Headache. ? Feelings of worry or nervousness (anxiety). ? Shortness of breath. ? Nosebleed. ? A feeling of being sick to your stomach (nausea). ? Throwing up (vomiting). ? Changes in how you see. ? Very bad chest pain. ? Seizures. How is this treated?  This condition is treated by making healthy lifestyle changes, such as: ? Eating healthy foods. ? Exercising more. ? Drinking less alcohol.  Your health care provider may prescribe medicine if lifestyle changes are not enough to get your blood pressure under control, and if: ? Your top number is above 130. ? Your bottom number is above 80.  Your personal target blood pressure may vary. Follow these instructions at home: Eating and drinking   If told, follow the DASH eating plan. To follow this plan: ? Fill one half of your plate at each meal with fruits and vegetables. ? Fill one fourth of your plate at each meal with whole grains. Whole grains include whole-wheat pasta, brown rice, and whole-grain bread. ? Eat or drink low-fat dairy products, such as skim milk or low-fat yogurt. ? Fill one fourth of your plate at each meal with low-fat  (lean) proteins. Low-fat proteins include fish, chicken without skin, eggs, beans, and tofu. ? Avoid fatty meat, cured and processed meat, or chicken with skin. ? Avoid pre-made or processed food.  Eat less than 1,500 mg of salt each day.  Do not drink alcohol if: ? Your doctor tells you not to drink. ? You are pregnant, may be pregnant, or are planning to become pregnant.  If you drink alcohol: ? Limit how much you use to:  0-1 drink a day for women.  0-2 drinks a day for men. ? Be aware of how much alcohol is in your drink. In the [place] one drink equals one 12 oz bottle of beer (355 mL), one 5 oz glass of wine (148 mL), or one 1 oz glass of hard liquor (44 mL). Lifestyle   Work with your doctor to stay at a healthy weight or to lose weight. Ask your doctor what the best weight is for you.  Get at least 30 minutes of exercise most days of the week. This may include walking, swimming, or biking.  Get at least 30 minutes of exercise that strengthens your muscles (resistance exercise) at least 3 days a week. This may include lifting weights or doing Pilates.  Do not use any products that contain nicotine or tobacco, such as cigarettes, e-cigarettes, and chewing tobacco. If you need help quitting, ask your doctor.  Check your blood pressure at home as told by your doctor.  Keep all follow-up visits as told by your doctor. This  is important. Medicines  Take over-the-counter and prescription medicines only as told by your doctor. Follow directions carefully.  Do not skip doses of blood pressure medicine. The medicine does not work as well if you skip doses. Skipping doses also puts you at risk for problems.  Ask your doctor about side effects or reactions to medicines that you should watch for. Contact a doctor if you:  Think you are having a reaction to the medicine you are taking.  Have headaches that keep coming back (recurring).  Feel dizzy.  Have swelling in your  ankles.  Have trouble with your vision. Get help right away if you:  Get a very bad headache.  Start to feel mixed up (confused).  Feel weak or numb.  Feel faint.  Have very bad pain in your: ? Chest. ? Belly (abdomen).  Throw up more than once.  Have trouble breathing. Summary  Hypertension is another name for high blood pressure.  High blood pressure forces your heart to work harder to pump blood.  For most people, a normal blood pressure is less than 120/80.  Making healthy choices can help lower blood pressure. If your blood pressure does not get lower with healthy choices, you may need to take medicine. This information is not intended to replace advice given to you by your health care provider. Make sure you discuss any questions you have with your health care provider. Document Released: 06/01/2008 Document Revised: 08/24/2018 Document Reviewed: 08/24/2018 Elsevier Patient Education  2020 [employer]

## 2019-07-24 NOTE — Progress Notes (Signed)
Established Patient Office Visit  Subjective:  Patient ID: [name], male    DOB: 1961-06-14  Age: 58 y.o. MRN: [id number]  CC: No chief complaint on file.   HPI [name] presents for routine follow up last seen 07/05/2018. He has not been compliant with follow up or taking medications. A1C today is 5.7 his diabetic medication will be discontinue. Hypertensive on arrival Denies  headaches, chest pain or lower extremity edema. 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  Past Medical History:  Diagnosis Date  . Hypertension   . Obesity     History reviewed. No pertinent surgical history.  History reviewed. No pertinent family history.  Social History   Socioeconomic History  . Marital status: Single    Spouse name: Not on file  . Number of children: Not on file  . Years of education: Not on file  . Highest education level: Not on file  Occupational History  . Not on file  Social Needs  . Financial resource strain: Not on file  . Food insecurity    Worry: Not on file    Inability: Not on file  . Transportation needs    Medical: Not on file    Non-medical: Not on file  Tobacco Use  . Smoking status: Never Smoker  . Smokeless tobacco: Never Used  Substance and Sexual Activity  . Alcohol use: Yes    Comment: beer  . Drug use: No  . Sexual activity: Yes  Lifestyle  . Physical activity    Days per week: Not on file    Minutes per session: Not on file  . Stress: Not on file  Relationships  . Social Herbalist on phone: Not on file    Gets together: Not on file    Attends religious service: Not on file    Active member of club or organization: Not on file    Attends meetings of clubs or organizations: Not on file    Relationship status: Not on file  . Intimate partner violence    Fear of current or ex partner: Not on file    Emotionally abused: Not on file     Physically abused: Not on file    Forced sexual activity: Not on file  Other Topics Concern  . Not on file  Social History Narrative  . Not on file    Outpatient Medications Prior to Visit  Medication Sig Dispense Refill  . ibuprofen (ADVIL) 600 MG tablet Take 1 tablet (600 mg total) by mouth every 8 (eight) hours as needed. 30 tablet 0  . naproxen (NAPROSYN) 500 MG tablet Take 1 tablet (500 mg total) by mouth 2 (two) times daily. (Patient not taking: Reported on 07/03/2019) 30 tablet 0  . amLODipine (NORVASC) 10 MG tablet Take 1 tablet (10 mg total) by mouth daily. (Patient not taking: Reported on 04/20/2019) 90 tablet 1  . hydrochlorothiazide (HYDRODIURIL) 25 MG tablet Take 1 tablet (25 mg total) by mouth daily. (Patient not taking: Reported on 04/20/2019) 90 tablet 1  . metFORMIN (GLUCOPHAGE XR) 500 MG 24 hr tablet Take 1 tablet (500 mg total) by mouth daily with breakfast. (Patient not taking: Reported on 04/20/2019) 90 tablet 1  . spironolactone (ALDACTONE) 25 MG tablet Take 0.5 tablets (12.5 mg total) by mouth daily. (Patient not taking: Reported on 04/20/2019) 90 tablet 1   No  facility-administered medications prior to visit.     No Known Allergies  ROS Review of Systems  Respiratory: Positive for shortness of breath.   Musculoskeletal:       Right knee pain swollen      Objective:    Physical Exam  Constitutional: He is oriented to person, place, and time. He appears well-developed and well-nourished.  HENT:  Head: Normocephalic.  Eyes: EOM are normal.  Neck: Normal range of motion. Neck supple.  Cardiovascular: Normal rate and regular rhythm.  Pulmonary/Chest: Effort normal and breath sounds normal.  Snoring gasp for air wife has mentioned this to you  Abdominal: Soft. Bowel sounds are normal. He exhibits distension.  Musculoskeletal:        General: Edema present.     Comments: Right knee  Neurological: He is oriented to person, place, and time.  Skin: Skin is warm.   Psychiatric: He has a normal mood and affect.    BP (!) 168/92 (BP Location: Left Arm, Patient Position: Sitting, Cuff Size: Large)   Pulse (!) 58   Temp (!) 97.3 F (36.3 C) (Tympanic)   Ht 6' (1.829 m)   Wt (!) 344 lb 6.4 oz (156.2 kg)   SpO2 95%   BMI 46.71 kg/m  Wt Readings from Last 3 Encounters:  07/24/19 (!) 344 lb 6.4 oz (156.2 kg)  07/05/18 (!) 359 lb 12.8 oz (163.2 kg)  06/07/18 (!) 357 lb (161.9 kg)     Health Maintenance Due  Topic Date Due  . Hepatitis C Screening  08[birth year]  . HIV Screening  05/05/1978  . TETANUS/TDAP  05/05/1982  . Fecal DNA (Cologuard)  05/05/2013    There are no preventive care reminders to display for this patient.  No results found for: TSH Lab Results  Component Value Date   WBC 7.3 05/09/2018   HGB 14.4 05/09/2018   HCT 44.4 05/09/2018   MCV 74.1 (L) 05/09/2018   PLT 183 05/09/2018   Lab Results  Component Value Date   NA 136 05/09/2018   K 3.5 05/09/2018   CO2 30 05/09/2018   GLUCOSE 141 (H) 05/09/2018   BUN 13 05/09/2018   CREATININE 1.56 (H) 05/09/2018   BILITOT 0.4 09/24/2017   ALKPHOS 69 09/24/2017   AST 19 09/24/2017   ALT 13 (L) 09/24/2017   PROT 7.1 09/24/2017   ALBUMIN 3.9 09/24/2017   CALCIUM 10.0 05/09/2018   ANIONGAP 11 05/09/2018   No results found for: CHOL No results found for: HDL No results found for: LDLCALC No results found for: TRIG No results found for: CHOLHDL Lab Results  Component Value Date   HGBA1C 5.8 (A) 07/24/2019      Assessment & Plan:   Problem List Items Addressed This Visit       Diagnoses and all orders for this visit:  Prediabetes  Clinical practice recommendations. Foods that are high in carbohydrates are the following rice, potatoes, breads, sugars, and pastas.  Reduction in the intake (eating) will assist in lowering your blood sugars. -     HgB A1c 5.8  Need for Tdap vaccination -     Tdap vaccine greater than or equal to 7yo IM  Special screening for  malignant neoplasms, colon Normal colon cancer screening.  CDC recommends colorectal screening from ages 58-75. Screening can begin at 15 or earlier in some cases. This screening is used for a disease when no symptoms are present . Diagnostic test is used for symptoms examples blood in stool, colorectal  polyps or coloector cancer, family history or inflammatory bowel disease - chron's or ulcerative colitis .(USPSTF) -     Fecal occult blood, imunochemical([facility]); Future  Medication refill -     hydrochlorothiazide (HYDRODIURIL) 25 MG tablet; Take 1 tablet (25 mg total) by mouth daily. -     spironolactone (ALDACTONE) 25 MG tablet; Take 0.5 tablets (12.5 mg total) by mouth daily. -     amLODipine (NORVASC) 10 MG tablet; Take 1 tablet (10 mg total) by mouth daily.  Essential hypertension Counseled on blood pressure goal of less than 130/80, low-sodium, DASH diet, medication compliance, 150 minutes of moderate intensity exercise per week. Discussed medication compliance, adverse effects. -     CBC with Differential -     CMP14+EGFR  OSA (obstructive sleep apnea) Referred for a sleep study patient is obese, thick supple neck and snores wakes up or wife nudges him than he gasp for air. -     Lipid Panel  Chronic pain of right knee Morbid obesity is also a factor of knee pain. Also, occupation is standing all day suggest purchasing a new pair of shoes with a arch and good support.  Screening for HIV (human immunodeficiency virus) -     HIV Antibody (routine testing w rflx)  Need for DTaP vaccine Given at this visit  Morbid obesity (HC) Morbid Obesity BMI 46.7 indicating an excess in caloric intake or underlining conditions. This may lead to other co-morbidities. Lifestyle modifications of diet and exercise may reduce obesity.  -     Lipid Panel -     CBC with Differential -     CMP14+EGFR  Screening for diabetes mellitus -     CBC with Differential  Health maintenance  examination -     HIV Antibody (routine testing w rflx) -     Lipid Panel -     CBC with Differential -     CMP14+EGFR    Meds ordered this encounter  Medications  . hydrochlorothiazide (HYDRODIURIL) 25 MG tablet    Sig: Take 1 tablet (25 mg total) by mouth daily.    Dispense:  90 tablet    Refill:  1  . spironolactone (ALDACTONE) 25 MG tablet    Sig: Take 0.5 tablets (12.5 mg total) by mouth daily.    Dispense:  90 tablet    Refill:  1  . amLODipine (NORVASC) 10 MG tablet    Sig: Take 1 tablet (10 mg total) by mouth daily.    Dispense:  90 tablet    Refill:  1    Follow-up: Return in about 2 weeks (around 08/07/2019) for Bp check.    [name], NP

## 2019-07-25 ENCOUNTER — Other Ambulatory Visit (INDEPENDENT_AMBULATORY_CARE_PROVIDER_SITE_OTHER): Payer: Self-pay | Admitting: Primary Care

## 2019-07-25 LAB — CMP14+EGFR
ALT: 6 IU/L (ref 0–44)
AST: 12 IU/L (ref 0–40)
Albumin/Globulin Ratio: 1.5 (ref 1.2–2.2)
Albumin: 4.3 g/dL (ref 3.8–4.9)
Alkaline Phosphatase: 72 IU/L (ref 39–117)
BUN/Creatinine Ratio: 9 (ref 9–20)
BUN: 11 mg/dL (ref 6–24)
Bilirubin Total: 0.5 mg/dL (ref 0.0–1.2)
CO2: 22 mmol/L (ref 20–29)
Calcium: 8.9 mg/dL (ref 8.7–10.2)
Chloride: 103 mmol/L (ref 96–106)
Creatinine, Ser: 1.23 mg/dL (ref 0.76–1.27)
GFR calc Af Amer: 75 mL/min/{1.73_m2} (ref >59)
GFR calc non Af Amer: 65 mL/min/{1.73_m2} (ref >59)
Globulin, Total: 2.9 g/dL (ref 1.5–4.5)
Glucose: 102 mg/dL — ABNORMAL HIGH (ref 65–99)
Potassium: 3.9 mmol/L (ref 3.5–5.2)
Sodium: 140 mmol/L (ref 134–144)
Total Protein: 7.2 g/dL (ref 6.0–8.5)

## 2019-07-25 LAB — CBC WITH DIFFERENTIAL/PLATELET
Basophils Absolute: 0 10*3/uL (ref 0.0–0.2)
Basos: 0 %
EOS (ABSOLUTE): 0.2 10*3/uL (ref 0.0–0.4)
Eos: 3 %
Hematocrit: 43.6 % (ref 37.5–51.0)
Hemoglobin: 13.6 g/dL (ref 13.0–17.7)
Immature Grans (Abs): 0 10*3/uL (ref 0.0–0.1)
Immature Granulocytes: 0 %
Lymphocytes Absolute: 1.8 10*3/uL (ref 0.7–3.1)
Lymphs: 32 %
MCH: 23.7 pg — ABNORMAL LOW (ref 26.6–33.0)
MCHC: 31.2 g/dL — ABNORMAL LOW (ref 31.5–35.7)
MCV: 76 fL — ABNORMAL LOW (ref 79–97)
Monocytes Absolute: 0.4 10*3/uL (ref 0.1–0.9)
Monocytes: 7 %
Neutrophils Absolute: 3.3 10*3/uL (ref 1.4–7.0)
Neutrophils: 58 %
Platelets: 164 10*3/uL (ref 150–450)
RBC: 5.73 x10E6/uL (ref 4.14–5.80)
RDW: 17.7 % — ABNORMAL HIGH (ref 11.6–15.4)
WBC: 5.7 10*3/uL (ref 3.4–10.8)

## 2019-07-25 LAB — LIPID PANEL
Chol/HDL Ratio: 5.2 ratio — ABNORMAL HIGH (ref 0.0–5.0)
Cholesterol, Total: 187 mg/dL (ref 100–199)
HDL: 36 mg/dL — ABNORMAL LOW (ref >39)
LDL Calculated: 124 mg/dL — ABNORMAL HIGH (ref 0–99)
Triglycerides: 137 mg/dL (ref 0–149)
VLDL Cholesterol Cal: 27 mg/dL (ref 5–40)

## 2019-07-25 LAB — HIV ANTIBODY (ROUTINE TESTING W REFLEX): HIV Screen 4th Generation wRfx: NONREACTIVE

## 2019-07-25 MED ORDER — ATORVASTATIN CALCIUM 20 MG PO TABS: 20.0000 mg | ORAL_TABLET | Freq: Every day | ORAL | 3 refills | Status: DC

## 2019-07-26 ENCOUNTER — Telehealth (INDEPENDENT_AMBULATORY_CARE_PROVIDER_SITE_OTHER): Payer: Self-pay

## 2019-07-26 NOTE — Telephone Encounter (Signed)
-----   Message from [name], NP sent at 07/25/2019  2:16 PM EDT ----- HIV -negative blood work shows Red blood cells are smaller than normal but does not cause any problems. Chol is elevated and atorvastatin is sent in  Healthy lifestyle Foods high in cholesterol or liver, fatty meats,cheese, butter avocados, nuts and seeds, chocolate and fried foods.

## 2019-07-26 NOTE — Telephone Encounter (Signed)
Patient returned call and was informed that HIV is negative. Cholesterol elevated, atorvastatin sent to pharmacy to help lower cholesterol. Advised patient to decrease consumption of fried foods, nuts/seeds, fatty meats, cheese, butter, chocolate and avocado. Nat Christen, CMA

## 2019-07-28 ENCOUNTER — Telehealth (INDEPENDENT_AMBULATORY_CARE_PROVIDER_SITE_OTHER): Payer: Self-pay

## 2019-07-28 NOTE — Telephone Encounter (Signed)
Patient called wanting to know why PCP did not fill his pain medication.  Patient would like a call back from PCP   Please advice [phone]  Thank you Whitney Post

## 2019-07-31 ENCOUNTER — Other Ambulatory Visit (INDEPENDENT_AMBULATORY_CARE_PROVIDER_SITE_OTHER): Payer: Self-pay | Admitting: Primary Care

## 2019-07-31 DIAGNOSIS — G8929 Other chronic pain: Secondary | ICD-10-CM

## 2019-07-31 MED ORDER — IBUPROFEN 600 MG PO TABS: 600.0000 mg | ORAL_TABLET | Freq: Three times a day (TID) | ORAL | 1 refills | Status: DC | PRN

## 2019-07-31 NOTE — Telephone Encounter (Signed)
Refilled ibuprofen

## 2019-08-02 NOTE — Telephone Encounter (Signed)
Left voicemail notifying patient that ibuprofen has been refilled. Call [facility] with any questions or concerns. Nat Christen, CMA

## 2019-08-07 ENCOUNTER — Ambulatory Visit (INDEPENDENT_AMBULATORY_CARE_PROVIDER_SITE_OTHER): Payer: Self-pay | Admitting: Primary Care

## 2019-08-08 ENCOUNTER — Ambulatory Visit: Payer: Self-pay

## 2020-08-17 ENCOUNTER — Encounter (HOSPITAL_COMMUNITY): Payer: Self-pay | Admitting: Emergency Medicine

## 2020-08-17 ENCOUNTER — Emergency Department (HOSPITAL_COMMUNITY)
Admission: EM | Admit: 2020-08-17 | Discharge: 2020-08-18 | Disposition: A | Discharge status: Home / Self Care | Payer: Self-pay | Attending: Emergency Medicine | Admitting: Emergency Medicine

## 2020-08-17 ENCOUNTER — Other Ambulatory Visit: Payer: Self-pay

## 2020-08-17 ENCOUNTER — Emergency Department (HOSPITAL_COMMUNITY): Payer: Self-pay

## 2020-08-17 DIAGNOSIS — I10 Essential (primary) hypertension: Secondary | ICD-10-CM | POA: Insufficient documentation

## 2020-08-17 DIAGNOSIS — M25561 Pain in right knee: Secondary | ICD-10-CM | POA: Insufficient documentation

## 2020-08-17 DIAGNOSIS — Z79899 Other long term (current) drug therapy: Secondary | ICD-10-CM | POA: Insufficient documentation

## 2020-08-17 NOTE — ED Triage Notes (Signed)
C/o R knee pain and swelling since this morning.  No known injury.  He believes it is related to standing on a hard floor at work.

## 2020-08-18 MED ORDER — IBUPROFEN 800 MG PO TABS: 800.0000 mg | ORAL_TABLET | Freq: Three times a day (TID) | ORAL | 0 refills | Status: DC | PRN

## 2020-08-18 NOTE — ED Notes (Signed)
Patient Alert and oriented to baseline. Stable and ambulatory to baseline. Patient verbalized understanding of the discharge instructions.  Patient belongings were taken by the patient.   

## 2020-08-18 NOTE — ED Provider Notes (Signed)
[facility] EMERGENCY DEPARTMENT Provider Note   CSN: [id number] Arrival date & time: 08/17/20  1319     History Chief Complaint  Patient presents with  . Knee Pain    [name] is a 59 y.o. male.  The history is provided by the patient.  Knee Pain Location:  Knee Knee location:  R knee Pain details:    Quality:  Aching   Radiates to:  Does not radiate   Severity:  Moderate   Onset quality:  Gradual   Timing:  Intermittent   Progression:  Waxing and waning Chronicity:  New Prior injury to area:  No Relieved by:  Acetaminophen Worsened by:  Bearing weight and activity Associated symptoms: swelling   Associated symptoms: no decreased ROM, no muscle weakness, no numbness and no tingling        Past Medical History:  Diagnosis Date  . Hypertension   . Obesity     Patient Active Problem List   Diagnosis Date Noted  . Acute pulmonary edema (HCC) 05/04/2018    History reviewed. No pertinent surgical history.     No family history on file.  Social History   Tobacco Use  . Smoking status: Never Smoker  . Smokeless tobacco: Never Used  Substance Use Topics  . Alcohol use: Yes    Comment: beer  . Drug use: No    Home Medications Prior to Admission medications   Medication Sig Start Date End Date Taking? Authorizing Provider  amLODipine (NORVASC) 10 MG tablet Take 1 tablet (10 mg total) by mouth daily. 07/24/19   Grayce Sessions, NP  atorvastatin (LIPITOR) 20 MG tablet Take 1 tablet (20 mg total) by mouth daily. 07/25/19   Grayce Sessions, NP  hydrochlorothiazide (HYDRODIURIL) 25 MG tablet Take 1 tablet (25 mg total) by mouth daily. 07/24/19   Grayce Sessions, NP  ibuprofen (ADVIL) 800 MG tablet Take 1 tablet (800 mg total) by mouth every 8 (eight) hours as needed for up to 39 doses for moderate pain. 08/18/20   Masayo Fera, DO  spironolactone (ALDACTONE) 25 MG tablet Take 0.5 tablets (12.5 mg total) by mouth daily.  07/24/19   Grayce Sessions, NP    Allergies    Patient has no known allergies.  Review of Systems   Review of Systems  Musculoskeletal: Positive for arthralgias, gait problem and joint swelling.  Skin: Negative for color change, pallor, rash and wound.  Neurological: Negative for seizures and weakness.    Physical Exam Updated Vital Signs  ED Triage Vitals  Enc Vitals Group     BP 08/17/20 1328 (!) 155/100     Pulse Rate 08/17/20 1328 71     Resp 08/17/20 1328 15     Temp 08/17/20 1328 98.6 F (37 C)     Temp Source 08/17/20 1328 Oral     SpO2 08/17/20 1328 96 %     Weight 08/17/20 1618 240 lb (108.9 kg)     Height 08/17/20 1618 6' (1.829 m)     Head Circumference --      Peak Flow --      Pain Score 08/17/20 1407 6     Pain Loc --      Pain Edu? --      Excl. in GC? --     Physical Exam Vitals and nursing note reviewed.  Constitutional:      General: He is not in acute distress.    Appearance: He is  well-developed. He is not ill-appearing.  Cardiovascular:     Pulses: Normal pulses.     Heart sounds: Normal heart sounds. No murmur heard.   Musculoskeletal:        General: Tenderness (right knee) present. No swelling. Normal range of motion.  Skin:    General: Skin is warm and dry.     Capillary Refill: Capillary refill takes less than 2 seconds.  Neurological:     Mental Status: He is alert.     Sensory: No sensory deficit.     Motor: No weakness.     ED Results / Procedures / Treatments   Labs (all labs ordered are listed, but only abnormal results are displayed) Labs Reviewed - No data to display  EKG None  Radiology DG Knee Complete 4 Views Right  Result Date: 08/17/2020 CLINICAL DATA:  Atraumatic right knee pain and swelling. EXAM: RIGHT KNEE - COMPLETE 4+ VIEW COMPARISON:  April 20, 2019 FINDINGS: No evidence of fracture, dislocation, or joint effusion. No evidence of arthropathy. A stable 1.0 cm round sclerotic focus is seen within the  lateral epicondyle of the distal right femur. Soft tissues are unremarkable. IMPRESSION: 1. No acute osseous abnormality. Electronically Signed   By: Aram Candela M.D.   On: 08/17/2020 15:43    Procedures Procedures (including critical care time)  Medications Ordered in ED Medications - No data to display  ED Course  I have reviewed the triage vital signs and the nursing notes.  Pertinent labs & imaging results that were available during my care of the patient were reviewed by me and considered in my medical decision making (see chart for details).    MDM Rules/Calculators/A&P                          [name] is a 59 year old male with history of high cholesterol presents the ED with right knee pain.  Patient with normal vitals.  No fever.  X-ray shows no acute fracture or malalignment.  There is no swelling in the knee on x-ray or on exam.  He has some tenderness to the suprapatellar region.  States that most of the pain and swelling occurs after standing at work for 13 hours.  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  He is neurovascularly intact.  He has normal range of motion of the right knee.  No concern for septic joint.  Likely overuse/arthritis.  Recommend Motrin, Tylenol, rest, ice, compression.  Discharged in good condition.  Understands return precautions.  This chart was dictated using voice recognition software.  Despite best efforts to proofread,  errors can occur which can change the documentation meaning.     Final Clinical Impression(s) / ED Diagnoses Final diagnoses:  Acute pain of right knee    Rx / DC Orders ED Discharge Orders         Ordered    ibuprofen (ADVIL) 800 MG tablet  Every 8 hours PRN        08/18/20 0749           Virgina Norfolk, DO 08/18/20 [phone]

## 2021-07-17 ENCOUNTER — Other Ambulatory Visit: Payer: Self-pay

## 2021-07-17 ENCOUNTER — Emergency Department (HOSPITAL_COMMUNITY)
Admission: EM | Admit: 2021-07-17 | Discharge: 2021-07-17 | Disposition: A | Discharge status: Home / Self Care | Payer: Self-pay | Attending: Emergency Medicine | Admitting: Emergency Medicine

## 2021-07-17 DIAGNOSIS — M545 Low back pain, unspecified: Secondary | ICD-10-CM | POA: Insufficient documentation

## 2021-07-17 DIAGNOSIS — M5431 Sciatica, right side: Secondary | ICD-10-CM

## 2021-07-17 DIAGNOSIS — Z5321 Procedure and treatment not carried out due to patient leaving prior to being seen by health care provider: Secondary | ICD-10-CM | POA: Insufficient documentation

## 2021-07-17 MED ORDER — OXYCODONE-ACETAMINOPHEN 5-325 MG PO TABS
2.0000 | ORAL_TABLET | Freq: Once | ORAL | Status: AC
Start: 2021-07-17 — End: 2021-07-17
  Administered 2021-07-17: 2 via ORAL
  Filled 2021-07-17: qty 2

## 2021-07-17 MED ORDER — MELOXICAM 7.5 MG PO TABS: 7.5000 mg | ORAL_TABLET | Freq: Every day | ORAL | 0 refills | Status: DC

## 2021-07-17 MED ORDER — CYCLOBENZAPRINE HCL 10 MG PO TABS: 10.0000 mg | ORAL_TABLET | Freq: Two times a day (BID) | ORAL | 0 refills | Status: DC | PRN

## 2021-07-17 NOTE — ED Triage Notes (Signed)
Pt here POV with c/o lower right back pain. Denies trauma . 8/10 pain.

## 2021-07-17 NOTE — ED Provider Notes (Signed)
[facility] EMERGENCY DEPARTMENT Provider Note   CSN: [id number] Arrival date & time: 12/15/13  0747     History Chief Complaint  Patient presents with   Chest Pain    [name] is a 60 y.o. male.  HPI 60 year old man complaining of pain radiating into his right buttocks.  Pain has been worsening.  Today pain is 8 out of 10.  He has not had any trauma.  He denies any weakness, loss of bowel, or bladder control he has not had any previous back problems or similar symptoms in the past.  He has not had any fever, chills, redness of the skin, or swelling    Past Medical History:  Diagnosis Date   Hypertension    Obesity     Patient Active Problem List   Diagnosis Date Noted   Acute pulmonary edema (HCC) 05/04/2018    History reviewed. No pertinent surgical history.     No family history on file.  Social History   Tobacco Use   Smoking status: Never   Smokeless tobacco: Never  Substance Use Topics   Alcohol use: Yes    Comment: beer   Drug use: No    Home Medications Prior to Admission medications   Medication Sig Start Date End Date Taking? Authorizing Provider  amLODipine (NORVASC) 10 MG tablet Take 1 tablet (10 mg total) by mouth daily. 07/24/19   Grayce Sessions, NP  atorvastatin (LIPITOR) 20 MG tablet Take 1 tablet (20 mg total) by mouth daily. 07/25/19   Grayce Sessions, NP  cyclobenzaprine (FLEXERIL) 10 MG tablet Take 1 tablet (10 mg total) by mouth 2 (two) times daily as needed for muscle spasms. 07/17/21   Margarita Grizzle, MD  hydrochlorothiazide (HYDRODIURIL) 25 MG tablet Take 1 tablet (25 mg total) by mouth daily. 07/24/19   Grayce Sessions, NP  ibuprofen (ADVIL) 800 MG tablet Take 1 tablet (800 mg total) by mouth every 8 (eight) hours as needed for up to 39 doses for moderate pain. 08/18/20   Curatolo, Adam, DO  meloxicam (MOBIC) 7.5 MG tablet Take 1 tablet (7.5 mg total) by mouth daily. 07/17/21   Margarita Grizzle, MD   spironolactone (ALDACTONE) 25 MG tablet Take 0.5 tablets (12.5 mg total) by mouth daily. 07/24/19   Grayce Sessions, NP    Allergies    Patient has no known allergies.  Review of Systems   Review of Systems  All other systems reviewed and are negative.  Physical Exam Updated Vital Signs BP 152/83 (BP Location: Right arm)   Pulse 67   Temp 98.9 F (37.2 C) (Oral)   Resp 17   Ht 1.829 m (6')   Wt 127 kg   SpO2 97%   BMI 37.97 kg/m   Physical Exam Vitals and nursing note reviewed.  Constitutional:      Appearance: He is well-developed.  HENT:     Head: Normocephalic and atraumatic.     Right Ear: External ear normal.     Left Ear: External ear normal.     Nose: Nose normal.  Eyes:     Extraocular Movements: Extraocular movements intact.  Neck:     Trachea: No tracheal deviation.  Cardiovascular:     Rate and Rhythm: Normal rate and regular rhythm.  Pulmonary:     Effort: Pulmonary effort is normal.  Musculoskeletal:        General: Normal range of motion.     Comments: Back exam reveals no  external signs of trauma No palpable tenderness No erythema or induration over right upper buttock where pain is radiating.  Skin:    General: Skin is warm and dry.     Capillary Refill: Capillary refill takes less than 2 seconds.  Neurological:     Mental Status: He is alert and oriented to person, place, and time.     Comments: Patient is patient is ambulatory without any weakness noted.  Psychiatric:        Mood and Affect: Mood normal.        Behavior: Behavior normal.    ED Results / Procedures / Treatments   Labs (all labs ordered are listed, but only abnormal results are displayed) Labs Reviewed  CBC WITH DIFFERENTIAL - Abnormal; Notable for the following components:      Result Value   WBC 10.9 (*)    RBC 5.93 (*)    MCV 72.7 (*)    MCH 25.0 (*)    RDW 15.9 (*)    Neutrophils Relative % 83 (*)    Neutro Abs 9.0 (*)    Lymphocytes Relative 11 (*)    All  other components within normal limits  POCT I-STAT, CHEM 8  POCT I-STAT TROPONIN I    EKG EKG Interpretation  Date/Time:  Friday December 15 2013 07:52:19 EST Ventricular Rate:  90 PR Interval:  190 QRS Duration: 102 QT Interval:  364 QTC Calculation: 445 R Axis:   6 Text Interpretation:  Normal sinus rhythm Normal ECG Confirmed by PICKERING  MD, NATHAN ([id number]) on 12/16/2013 7:15:44 AM  Radiology No results found.  Procedures Procedures   Medications Ordered in ED Medications - No data to display  ED Course  I have reviewed the triage vital signs and the nursing notes.  Pertinent labs & imaging results that were available during my care of the patient were reviewed by me and considered in my medical decision making (see chart for details).    MDM Rules/Calculators/A&P                           Patient with symptoms consistent with sciatica.  No evidence of acute neurological deficit.  Patient was significantly hypertensive but this is improved just on recheck.  He is advised regarding need for follow-up and recheck. Final Clinical Impression(s) / ED Diagnoses Final diagnoses:  Chest pain, atypical    Rx / DC Orders ED Discharge Orders          Ordered    azithromycin (ZITHROMAX) 250 MG tablet  Daily,   Status:  Discontinued        12/15/13 1024             Margarita Grizzle, MD 07/17/21 [phone]

## 2021-07-17 NOTE — Discharge Instructions (Addendum)
Please have your blood pressure rechecked at  your doctor's office this week.

## 2021-07-21 ENCOUNTER — Ambulatory Visit (INDEPENDENT_AMBULATORY_CARE_PROVIDER_SITE_OTHER): Payer: Self-pay | Admitting: Primary Care

## 2021-07-28 ENCOUNTER — Ambulatory Visit (INDEPENDENT_AMBULATORY_CARE_PROVIDER_SITE_OTHER): Payer: Self-pay | Admitting: Primary Care

## 2021-08-20 ENCOUNTER — Other Ambulatory Visit: Payer: Self-pay

## 2021-08-20 ENCOUNTER — Encounter (INDEPENDENT_AMBULATORY_CARE_PROVIDER_SITE_OTHER): Payer: Self-pay | Admitting: Primary Care

## 2021-08-20 ENCOUNTER — Ambulatory Visit (INDEPENDENT_AMBULATORY_CARE_PROVIDER_SITE_OTHER): Payer: Self-pay | Admitting: Primary Care

## 2021-08-20 VITALS — BP 87/60 | HR 59 | Temp 97.3°F | Ht 72.0 in | Wt 328.4 lb

## 2021-08-20 DIAGNOSIS — Z760 Encounter for issue of repeat prescription: Secondary | ICD-10-CM

## 2021-08-20 DIAGNOSIS — G8929 Other chronic pain: Secondary | ICD-10-CM

## 2021-08-20 DIAGNOSIS — M545 Low back pain, unspecified: Secondary | ICD-10-CM

## 2021-08-20 DIAGNOSIS — I952 Hypotension due to drugs: Secondary | ICD-10-CM

## 2021-08-20 DIAGNOSIS — Z6841 Body Mass Index (BMI) 40.0 and over, adult: Secondary | ICD-10-CM

## 2021-08-20 MED ORDER — CYCLOBENZAPRINE HCL 10 MG PO TABS: 10.0000 mg | ORAL_TABLET | Freq: Two times a day (BID) | ORAL | 0 refills | Status: DC | PRN

## 2021-08-20 NOTE — Patient Instructions (Signed)
Hypotension As your heart beats, it forces blood through your body. This force is called blood pressure. If you have hypotension, you have low blood pressure. When your blood pressure is too low, you may not get enough blood to your brain or other parts of your body. This may cause you to feel weak, light-headed, have a fast heartbeat, or even pass out (faint). Low blood pressure may be harmless, or it may cause serious problems. What are the causes? Blood loss. Not enough water in the body (dehydration). Heart problems. Hormone problems. Pregnancy. A very bad infection. Not having enough of certain nutrients. Very bad allergic reactions. Certain medicines. What increases the risk? Age. The risk increases as you get older. Conditions that affect the heart or the brain and spinal cord (central nervous system). Taking certain medicines. Being pregnant. What are the signs or symptoms? Feeling: Weak. Light-headed. Dizzy. Tired (fatigued). Blurred vision. Fast heartbeat. Passing out, in very bad cases. How is this treated? Changing your diet. This may involve eating more salt (sodium) or drinking more water. Taking medicines to raise your blood pressure. Changing how much you take (the dosage) of some of your medicines. Wearing compression stockings. These stockings help to prevent blood clots and reduce swelling in your legs. In some cases, you may need to go to the hospital for: Fluid replacement. This means you will receive fluids through an IV tube. Blood replacement. This means you will receive donated blood through an IV tube (transfusion). Treating an infection or heart problems, if this applies. Monitoring. You may need to be monitored while medicines that you are taking wear off. Follow these instructions at home: Eating and drinking  Drink enough fluids to keep your pee (urine) pale yellow. Eat a healthy diet. Follow instructions from your doctor about what you can eat or  drink. A healthy diet includes: Fresh fruits and vegetables. Whole grains. Low-fat (lean) meats. Low-fat dairy products. Eat extra salt only as told. Do not add extra salt to your diet unless your doctor tells you to. Eat small meals often. Avoid standing up quickly after you eat.  Medicines Take over-the-counter and prescription medicines only as told by your doctor. Follow instructions from your doctor about changing how much you take of your medicines, if this applies. Do not stop or change any of your medicines on your own. General instructions  Wear compression stockings as told by your doctor. Get up slowly from lying down or sitting. Avoid hot showers and a lot of heat as told by your doctor. Return to your normal activities as told by your doctor. Ask what activities are safe for you. Do not use any products that contain nicotine or tobacco, such as cigarettes, e-cigarettes, and chewing tobacco. If you need help quitting, ask your doctor. Keep all follow-up visits as told by your doctor. This is important.  Contact a doctor if: You throw up (vomit). You have watery poop (diarrhea). You have a fever for more than 2-3 days. You feel more thirsty than normal. You feel weak and tired. Get help right away if: You have chest pain. You have a fast or uneven heartbeat. You lose feeling (have numbness) in any part of your body. You cannot move your arms or your legs. You have trouble talking. You get sweaty or feel light-headed. You pass out. You have trouble breathing. You have trouble staying awake. You feel mixed up (confused). Summary Hypotension is also called low blood pressure. It is when the force of   blood pumping through your arteries is too weak. Hypotension may be harmless, or it may cause serious problems. Treatment may include changing your diet and medicines, and wearing compression stockings. In very bad cases, you may need to go to the hospital. This  information is not intended to replace advice given to you by your health care provider. Make sure you discuss any questions you have with your healthcare provider. Document Revised: 06/09/2018 Document Reviewed: 06/09/2018 Elsevier Patient Education  2022 [employer]vier Inc.  
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
## 2021-10-21 ENCOUNTER — Encounter (HOSPITAL_COMMUNITY): Payer: Self-pay | Admitting: Emergency Medicine

## 2021-10-21 ENCOUNTER — Emergency Department (HOSPITAL_COMMUNITY): Payer: Self-pay

## 2021-10-21 ENCOUNTER — Emergency Department (HOSPITAL_COMMUNITY)
Admission: EM | Admit: 2021-10-21 | Discharge: 2021-10-21 | Disposition: A | Discharge status: Home / Self Care | Payer: Self-pay | Attending: Emergency Medicine | Admitting: Emergency Medicine

## 2021-10-21 DIAGNOSIS — S300XXA Contusion of lower back and pelvis, initial encounter: Secondary | ICD-10-CM | POA: Insufficient documentation

## 2021-10-21 DIAGNOSIS — W108XXA Fall (on) (from) other stairs and steps, initial encounter: Secondary | ICD-10-CM | POA: Insufficient documentation

## 2021-10-21 DIAGNOSIS — S39012A Strain of muscle, fascia and tendon of lower back, initial encounter: Secondary | ICD-10-CM

## 2021-10-21 DIAGNOSIS — I10 Essential (primary) hypertension: Secondary | ICD-10-CM | POA: Insufficient documentation

## 2021-10-21 DIAGNOSIS — Z79899 Other long term (current) drug therapy: Secondary | ICD-10-CM | POA: Insufficient documentation

## 2021-10-21 MED ORDER — METHOCARBAMOL 500 MG PO TABS: 500.0000 mg | ORAL_TABLET | Freq: Two times a day (BID) | ORAL | 0 refills | Status: DC

## 2021-10-21 MED ORDER — CELECOXIB 200 MG PO CAPS: 200.0000 mg | ORAL_CAPSULE | Freq: Two times a day (BID) | ORAL | 0 refills | Status: DC

## 2021-10-21 MED ORDER — HYDROCODONE-ACETAMINOPHEN 5-325 MG PO TABS
1.0000 | ORAL_TABLET | Freq: Once | ORAL | Status: AC
  Administered 2021-10-21: 1 via ORAL
  Filled 2021-10-21: qty 1

## 2021-10-21 NOTE — Discharge Instructions (Addendum)
X-ray showed some arthritis in the left lower back.  Take these medications that I prescribed for pain.  Follow closely with your primary care physician for further evaluation SEEK IMMEDIATE MEDICAL ATTENTION IF: New numbness, tingling, weakness, or problem with the use of your arms or legs.  Severe back pain not relieved with medications.  Change in bowel or bladder control.  Increasing pain in any areas of the body (such as chest or abdominal pain).  Shortness of breath, dizziness or fainting.  Nausea (feeling sick to your stomach), vomiting, fever, or sweats.

## 2021-10-21 NOTE — ED Notes (Signed)
DC instructions reviewed with pt. PT verbalized understanding. PT DC °

## 2021-10-21 NOTE — ED Triage Notes (Signed)
Pt reports falling on his back off of the steps on Saturday. Endorses lower back pain. Denies LOC or blood thinners.

## 2021-10-21 NOTE — ED Provider Notes (Signed)
[facility] EMERGENCY DEPARTMENT Provider Note   CSN: [id number] Arrival date & time: 10/21/21  1546     History Chief Complaint  Patient presents with   [name] is a 60 y.o. male who presents emergency department for back pain.  Patient states that he missed a step yesterday and landed directly on his back.  He complains of pain in his low back.  Pain is worse with movement, better with rest, he has taken Tylenol with only moderate relief in his symptoms.  He denies leg weakness, saddle anesthesia, bowel or bladder incontinence.   Fall      Past Medical History:  Diagnosis Date   Hypertension    Obesity     Patient Active Problem List   Diagnosis Date Noted   Acute pulmonary edema (HCC) 05/04/2018    History reviewed. No pertinent surgical history.     No family history on file.  Social History   Tobacco Use   Smoking status: Never   Smokeless tobacco: Never  Substance Use Topics   Alcohol use: Yes    Comment: beer   Drug use: No    Home Medications Prior to Admission medications   Medication Sig Start Date End Date Taking? Authorizing Provider  amLODipine (NORVASC) 10 MG tablet Take 1 tablet (10 mg total) by mouth daily. Patient not taking: Reported on 08/20/2021 07/24/19   Grayce Sessions, NP  atorvastatin (LIPITOR) 20 MG tablet Take 1 tablet (20 mg total) by mouth daily. Patient not taking: Reported on 08/20/2021 07/25/19   Grayce Sessions, NP  cyclobenzaprine (FLEXERIL) 10 MG tablet Take 1 tablet (10 mg total) by mouth 2 (two) times daily as needed for muscle spasms. 08/20/21   Grayce Sessions, NP  hydrochlorothiazide (HYDRODIURIL) 25 MG tablet Take 1 tablet (25 mg total) by mouth daily. Patient not taking: Reported on 08/20/2021 07/24/19   Grayce Sessions, NP  spironolactone (ALDACTONE) 25 MG tablet Take 0.5 tablets (12.5 mg total) by mouth daily. Patient not taking: Reported on 08/20/2021 07/24/19    Grayce Sessions, NP    Allergies    Patient has no known allergies.  Review of Systems   Review of Systems Ten systems reviewed and are negative for acute change, except as noted in the HPI.   Physical Exam Updated Vital Signs BP (!) 157/87 (BP Location: Right Arm)   Pulse 67   Temp 98.7 F (37.1 C) (Oral)   Resp 18   SpO2 98%   Physical Exam Vitals and nursing note reviewed.  Constitutional:      General: He is not in acute distress.    Appearance: He is well-developed. He is not diaphoretic.  HENT:     Head: Normocephalic and atraumatic.  Eyes:     General: No scleral icterus.    Conjunctiva/sclera: Conjunctivae normal.  Cardiovascular:     Rate and Rhythm: Normal rate and regular rhythm.     Heart sounds: Normal heart sounds.  Pulmonary:     Effort: Pulmonary effort is normal. No respiratory distress.     Breath sounds: Normal breath sounds.  Abdominal:     Palpations: Abdomen is soft.     Tenderness: There is no abdominal tenderness.  Musculoskeletal:     Cervical back: Normal range of motion and neck supple.     Comments: Mild bruising midline, tenderness to palpation over the midline lumbar region, range of motion limited due to pain, normal strength  in lower extremities  Skin:    General: Skin is warm and dry.  Neurological:     Mental Status: He is alert.  Psychiatric:        Behavior: Behavior normal.    ED Results / Procedures / Treatments   Labs (all labs ordered are listed, but only abnormal results are displayed) Labs Reviewed - No data to display  EKG None  Radiology No results found.  Procedures Procedures   Medications Ordered in ED Medications - No data to display  ED Course  I have reviewed the triage vital signs and the nursing notes.  Pertinent labs & imaging results that were available during my care of the patient were reviewed by me and considered in my medical decision making (see chart for details).    MDM  Rules/Calculators/A&P                           Patient with back pain.  I ordered and reviewed plain films of the lower back which show degenerative changes without acute fracture or other abnormality no neurological deficits and normal neuro exam.  Patient can walk but states is painful.  No loss of bowel or bladder control.  No concern for cauda equina.  No fever, night sweats, weight loss, h/o cancer, IVDU.  RICE protocol and pain medicine indicated and discussed with patient.   Final Clinical Impression(s) / ED Diagnoses Final diagnoses:  None    Rx / DC Orders ED Discharge Orders     None        Arthor Captain, PA-C 10/22/21 2376    Sloan Leiter, DO 10/22/21 2007

## 2022-01-02 IMAGING — CR DG LUMBAR SPINE COMPLETE 4+V
5 series · 5 of 5 positions shown · non-contrast
Comparison: None.

CLINICAL DATA: Status post fall.

EXAM:
LUMBAR SPINE - COMPLETE 4+ VIEW

[l-spine ap]
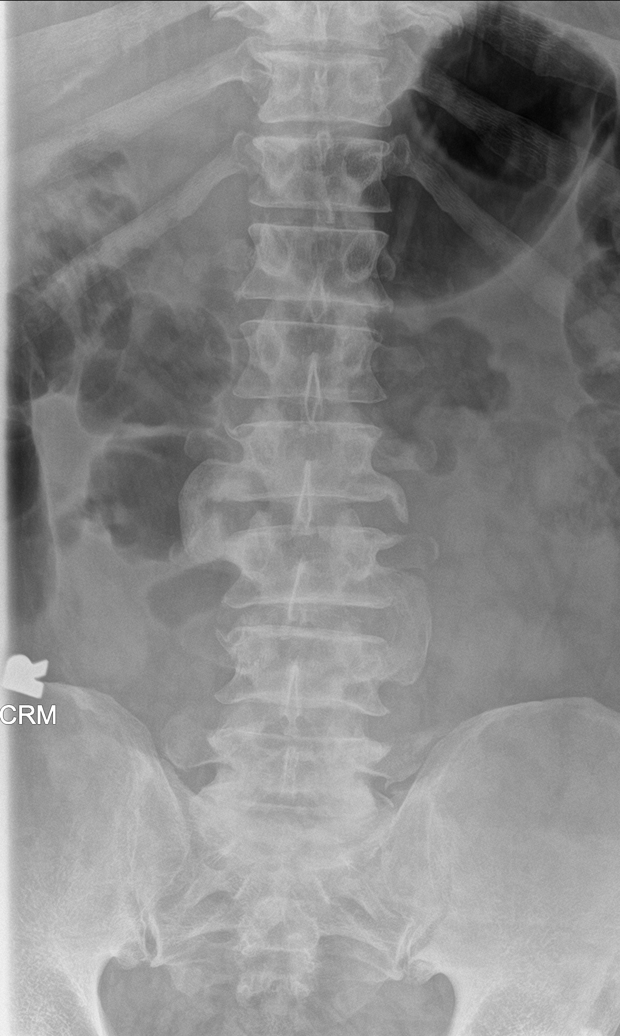

[l-spine obl (1 of 2)]
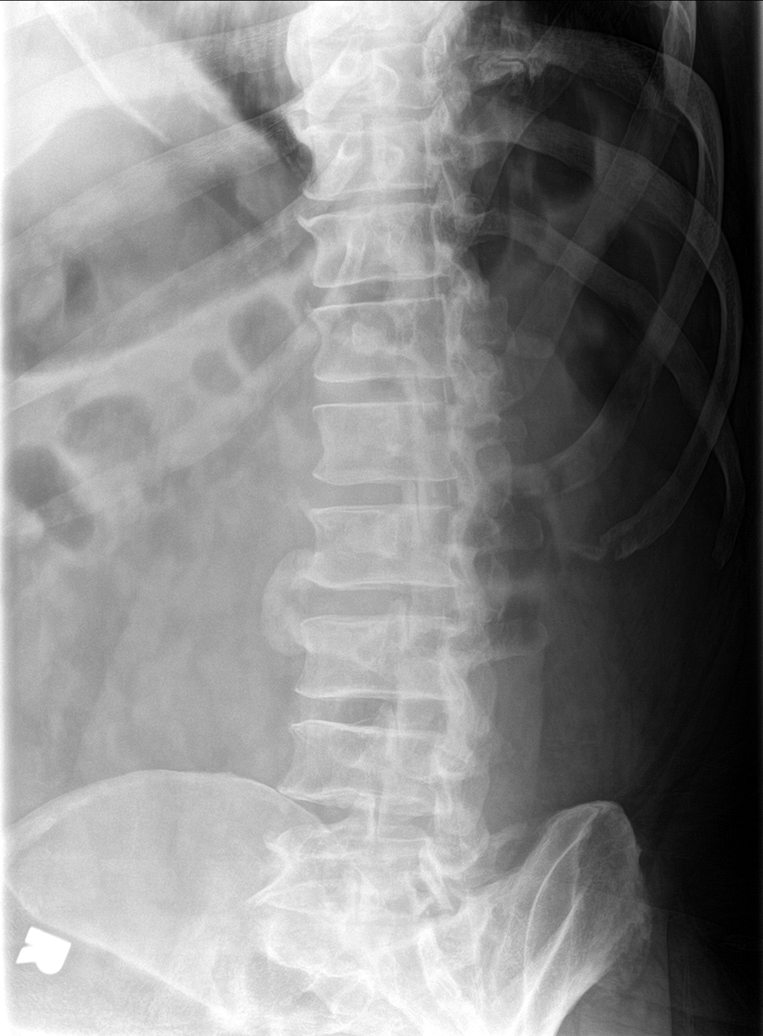

[l-spine obl (2 of 2)]
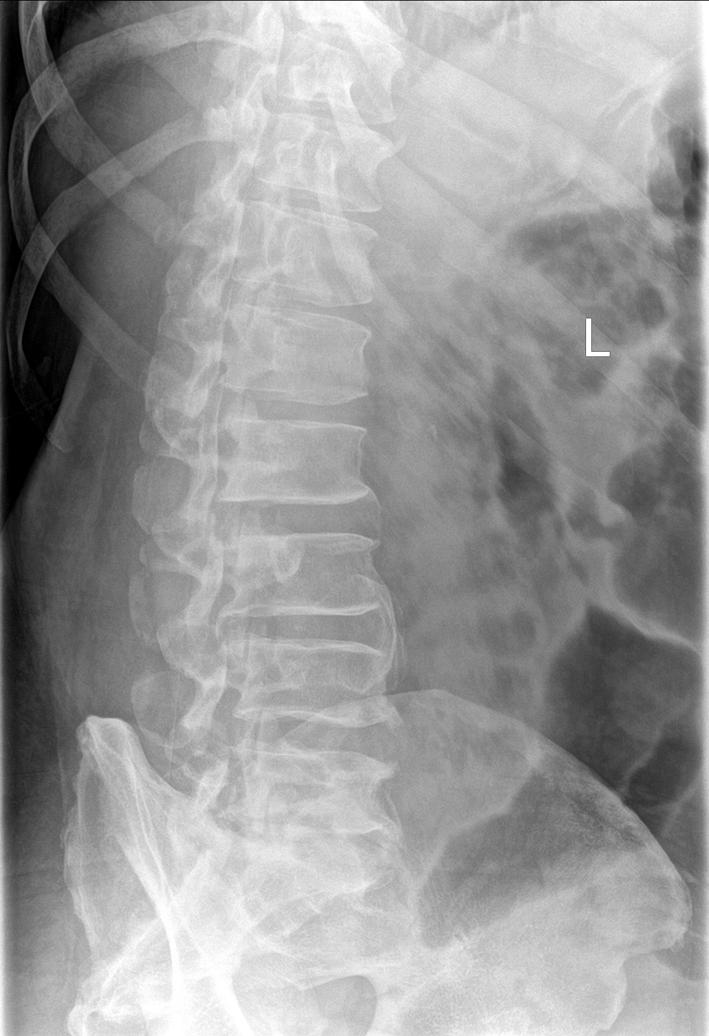

[l-spine lat]
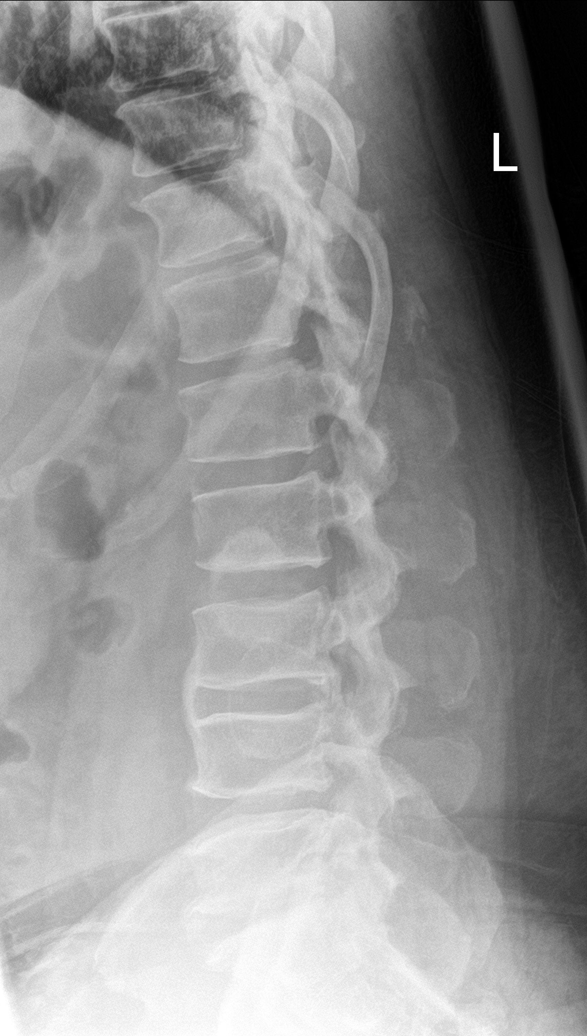

[l-spine spot]
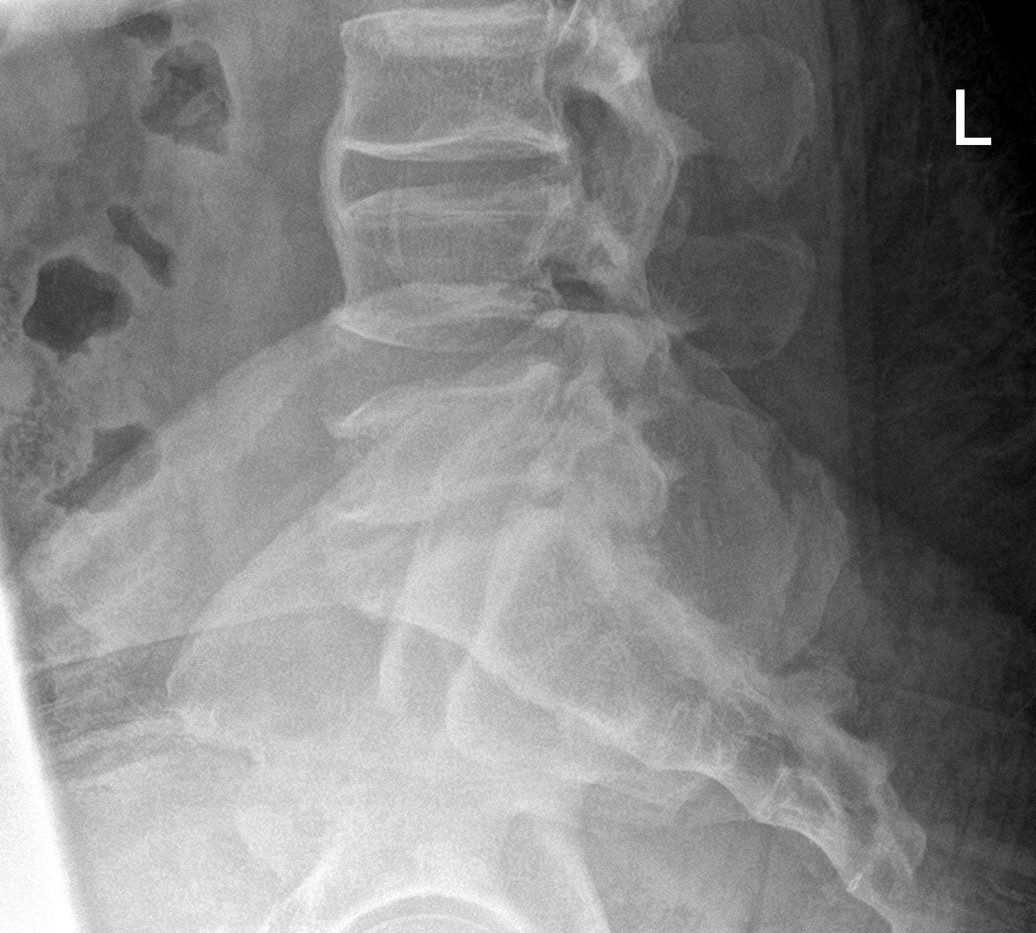

[5 of 5 positions shown; findings below may reference images not displayed]

FINDINGS: There is no evidence of lumbar spine fracture. Alignment is normal.
Moderate to marked severity endplate sclerosis is seen throughout
the lumbar spine, most prominent at the level of L5-S1. Moderate to
marked severity lateral osteophyte formation is present. This is
most prominent at the level of L2-L3 on the right and L3-L4 on the
left. Mild multilevel intervertebral disc space narrowing is noted.
IMPRESSION: 1. No acute findings in the lumbar spine.
2. Moderate to marked severity multilevel degenerative changes.

## 2022-06-16 ENCOUNTER — Ambulatory Visit (INDEPENDENT_AMBULATORY_CARE_PROVIDER_SITE_OTHER): Payer: Self-pay | Admitting: Primary Care

## 2022-07-09 ENCOUNTER — Ambulatory Visit (INDEPENDENT_AMBULATORY_CARE_PROVIDER_SITE_OTHER): Payer: Self-pay | Admitting: Primary Care

## 2022-07-29 ENCOUNTER — Ambulatory Visit (INDEPENDENT_AMBULATORY_CARE_PROVIDER_SITE_OTHER): Payer: Self-pay | Admitting: Primary Care

## 2022-08-12 ENCOUNTER — Ambulatory Visit (INDEPENDENT_AMBULATORY_CARE_PROVIDER_SITE_OTHER): Payer: Self-pay | Admitting: Primary Care

## 2022-10-05 ENCOUNTER — Ambulatory Visit (INDEPENDENT_AMBULATORY_CARE_PROVIDER_SITE_OTHER): Payer: Self-pay | Admitting: Primary Care

## 2022-10-28 ENCOUNTER — Ambulatory Visit (INDEPENDENT_AMBULATORY_CARE_PROVIDER_SITE_OTHER): Payer: Self-pay | Admitting: Primary Care

## 2023-01-13 ENCOUNTER — Emergency Department (HOSPITAL_COMMUNITY): Payer: Self-pay

## 2023-01-13 ENCOUNTER — Emergency Department (HOSPITAL_COMMUNITY)
Admission: EM | Admit: 2023-01-13 | Discharge: 2023-01-13 | Disposition: A | Discharge status: Home / Self Care | Payer: Self-pay | Attending: Emergency Medicine | Admitting: Emergency Medicine

## 2023-01-13 ENCOUNTER — Other Ambulatory Visit: Payer: Self-pay

## 2023-01-13 DIAGNOSIS — M25552 Pain in left hip: Secondary | ICD-10-CM | POA: Insufficient documentation

## 2023-01-13 DIAGNOSIS — Z79899 Other long term (current) drug therapy: Secondary | ICD-10-CM | POA: Insufficient documentation

## 2023-01-13 DIAGNOSIS — Z760 Encounter for issue of repeat prescription: Secondary | ICD-10-CM

## 2023-01-13 DIAGNOSIS — I10 Essential (primary) hypertension: Secondary | ICD-10-CM | POA: Insufficient documentation

## 2023-01-13 MED ORDER — AMLODIPINE BESYLATE 10 MG PO TABS: 10.0000 mg | ORAL_TABLET | Freq: Every day | ORAL | 1 refills | Status: DC

## 2023-01-13 MED ORDER — ACETAMINOPHEN 500 MG PO TABS: 500.0000 mg | ORAL_TABLET | Freq: Four times a day (QID) | ORAL | 0 refills | Status: DC | PRN

## 2023-01-13 MED ORDER — SPIRONOLACTONE 25 MG PO TABS: 12.5000 mg | ORAL_TABLET | Freq: Every day | ORAL | 1 refills | Status: DC

## 2023-01-13 MED ORDER — IBUPROFEN 600 MG PO TABS: 600.0000 mg | ORAL_TABLET | Freq: Four times a day (QID) | ORAL | 0 refills | Status: DC | PRN

## 2023-01-13 MED ORDER — HYDROCHLOROTHIAZIDE 25 MG PO TABS: 25.0000 mg | ORAL_TABLET | Freq: Every day | ORAL | 1 refills | Status: DC

## 2023-01-13 NOTE — ED Triage Notes (Signed)
Pt. Stated, my BP has been high and Ive had this left hip pain since yesterday it comes and goes.

## 2023-01-13 NOTE — Discharge Instructions (Addendum)
You came to the emergency department today with pain to your left hip and elevated blood pressure.  You tell me that you do not have a history of high blood pressure however you have been prescribed multiple medications in the past.  It is very important that you take these to avoid having damage to your heart, kidneys or stroke.  I have refilled these medications.  You may use Tylenol and ibuprofen in alternation for your pain in your hip.  Ultimately, please follow-up with your primary care doctor about all of these concerns.  This is very important.  Do not hesitate to return to the emergency department with any recurring or worsening symptoms.

## 2023-01-13 NOTE — ED Provider Triage Note (Signed)
Emergency Medicine Provider Triage Evaluation Note  [name] , a 62 y.o. male  was evaluated in triage.  Patient complains of left hip pain that comes and goes for a long time.  Denies any falls.  Also says that his blood pressure has been high.  He does not take any medications although he has previously been prescribed medications.  He says "I just let those run out and do not take them."  No headache, chest pain, shortness of breath, back pain or abdominal pain  Review of Systems  Positive:  Negative:   Physical Exam  BP (!) 140/110   Pulse 100   Temp 98 F (36.7 C) (Oral)   Resp 17   Ht 6' (1.829 m)   Wt 131.5 kg   SpO2 100%   BMI 39.33 kg/m  Gen:   Awake, no distress   Resp:  Normal effort  MSK:   Moves extremities without difficulty  Other:  TTP to the left hip, ambulatory without a limp.  Medical Decision Making  Medically screening exam initiated at 10:29 AM.  Appropriate orders placed.  ALEJANDRA BARNA was informed that the remainder of the evaluation will be completed by another provider, this initial triage assessment does not replace that evaluation, and the importance of remaining in the ED until their evaluation is complete.     Rhae Hammock, PA-C 01/13/23 1055

## 2023-01-13 NOTE — ED Provider Notes (Signed)
[facility] EMERGENCY DEPARTMENT Provider Note   CSN: [id number] Arrival date & time: 01/13/23  4098     History  Chief Complaint  Patient presents with   Hypertension   Hip Pain    [name] is a 62 y.o. male with a past medical history of hypertension that the patient denies presenting today with intermittent left hip pain on and off for multiple weeks.  Says that he does not take any medication when it comes on and just tries to see if it will resolve.   Hypertension  Hip Pain       Home Medications Prior to Admission medications   Medication Sig Start Date End Date Taking? Authorizing Provider  amLODipine (NORVASC) 10 MG tablet Take 1 tablet (10 mg total) by mouth daily. Patient not taking: Reported on 08/20/2021 07/24/19   [name], NP  atorvastatin (LIPITOR) 20 MG tablet Take 1 tablet (20 mg total) by mouth daily. Patient not taking: Reported on 08/20/2021 07/25/19   [name], NP  celecoxib (CELEBREX) 200 MG capsule Take 1 capsule (200 mg total) by mouth 2 (two) times daily. 10/21/21   Margarita Mail, PA-C  cyclobenzaprine (FLEXERIL) 10 MG tablet Take 1 tablet (10 mg total) by mouth 2 (two) times daily as needed for muscle spasms. 08/20/21   [name], NP  hydrochlorothiazide (HYDRODIURIL) 25 MG tablet Take 1 tablet (25 mg total) by mouth daily. Patient not taking: Reported on 08/20/2021 07/24/19   [name], NP  methocarbamol (ROBAXIN) 500 MG tablet Take 1 tablet (500 mg total) by mouth 2 (two) times daily. 10/21/21   Margarita Mail, PA-C  spironolactone (ALDACTONE) 25 MG tablet Take 0.5 tablets (12.5 mg total) by mouth daily. Patient not taking: Reported on 08/20/2021 07/24/19   [name], NP      Allergies    Patient has no known allergies.    Review of Systems   Review of Systems  Physical Exam Updated Vital Signs BP (!) 140/110   Pulse 100   Temp 98 F (36.7 C) (Oral)   Resp 17    Ht 6' (1.829 m)   Wt 131.5 kg   SpO2 100%   BMI 39.33 kg/m  Physical Exam Vitals and nursing note reviewed.  Constitutional:      Appearance: Normal appearance.  HENT:     Head: Normocephalic and atraumatic.  Eyes:     General: No scleral icterus.    Conjunctiva/sclera: Conjunctivae normal.  Pulmonary:     Effort: Pulmonary effort is normal. No respiratory distress.  Musculoskeletal:     Comments: Ambulatory with full range of motion of the left hip.  Somewhat TTP however neurovascularly intact distally with a DP pulse  Skin:    Findings: No rash.  Neurological:     Mental Status: He is alert.  Psychiatric:        Mood and Affect: Mood normal.     ED Results / Procedures / Treatments   Labs (all labs ordered are listed, but only abnormal results are displayed) Labs Reviewed - No data to display  EKG None  Radiology DG Hip Unilat W or Wo Pelvis 2-3 Views Left  Result Date: 01/13/2023 CLINICAL DATA:  Left hip pain EXAM: DG HIP (WITH OR WITHOUT PELVIS) 2-3V LEFT COMPARISON:  None Available. FINDINGS: There is no evidence of hip fracture or dislocation. Mild superolateral hip joint space narrowing with acetabular lip spurring. Degenerate disc disease of the lower lumbar  spine. IMPRESSION: Mild left hip osteoarthritis. Electronically Signed   By: Keane Police D.O.   On: 01/13/2023 10:50    Procedures Procedures   Medications Ordered in ED Medications - No data to display  ED Course/ Medical Decision Making/ A&P                           Medical Decision Making Amount and/or Complexity of Data Reviewed Radiology: ordered.  Risk OTC drugs. Prescription drug management.   61 year old male presenting today with hip pain.  Atraumatic.  Differential includes but is not limited to septic arthritis, bursitis, gout, pseudogout, fracture, dislocation and AVN    Imaging: X-ray ordered, viewed and interpreted by me.  Agree with radiology that there are no  abnormalities.  MDM/disposition: 62 year old male with a past medical history of atraumatic hip pain.  Reports that it has been going on for multiple weeks.  Does not try any medications for this.  Of note, patient is obese, likely contributes to some of his lower extremity discomfort.  Very low suspicion septic joint, joint is not warm and his vital signs are stable.  Also very low suspicion of bursitis.  No fracture or dislocation on his x-ray.  No history of sickle cell and I have a very low suspicion AVN.  At this time I believe his pain is musculoskeletal.  He can follow-up outpatient with his PCP.  Additionally, he denies having hypertension however this is listed in his chart multiple times.  He is supposed to be taking spironolactone, amlodipine and HCTZ.  These have been refilled.  Ultimately he will follow-up with his doctor.  Patient was agreeable to discharge from the waiting room without further evaluation in the back of the department.  Discharged in ambulatory condition with a work note  Final Clinical Impression(s) / ED Diagnoses Final diagnoses:  Left hip pain  Uncontrolled hypertension    Rx / DC Orders ED Discharge Orders          Ordered    amLODipine (NORVASC) 10 MG tablet  Daily        01/13/23 1240    hydrochlorothiazide (HYDRODIURIL) 25 MG tablet  Daily        01/13/23 1240    spironolactone (ALDACTONE) 25 MG tablet  Daily        01/13/23 1240    acetaminophen (TYLENOL) 500 MG tablet  Every 6 hours PRN        01/13/23 1240    ibuprofen (ADVIL) 600 MG tablet  Every 6 hours PRN        01/13/23 1240           Results and diagnoses were explained to the patient. Return precautions discussed in full. Patient had no additional questions and expressed complete understanding.   This chart was dictated using voice recognition software.  Despite best efforts to proofread,  errors can occur which can change the documentation meaning.    Rhae Hammock,  PA-C 01/13/23 1321    Regan Lemming, MD 01/13/23 1534

## 2023-01-27 ENCOUNTER — Ambulatory Visit (HOSPITAL_COMMUNITY)
Admission: EM | Admit: 2023-01-27 | Discharge: 2023-01-27 | Disposition: A | Discharge status: Home / Self Care | Payer: No Payment, Other | Attending: Family | Admitting: Family

## 2023-01-27 DIAGNOSIS — F1410 Cocaine abuse, uncomplicated: Secondary | ICD-10-CM

## 2023-01-27 MED ORDER — AMLODIPINE BESYLATE 10 MG PO TABS
10.0000 mg | ORAL_TABLET | Freq: Every day | ORAL | Status: DC
  Filled 2023-01-27: qty 7

## 2023-01-27 NOTE — Progress Notes (Signed)
   01/27/23 1345  [place] (Walk-ins at [facility] only)  How Did You Hear About [place]? Legal System  What Is the Reason for Your Visit/Call Today? Addiction Problem; Pt presents to [facility] voluntary and via [employer].  Pt denies SI, HI, or AVH.  Pt reports using crack cocaine daily, $300.00 to $400.00. Pt report he wants to stop using.  Pt denies any prior MH diagnosis or prescribed medication for symptom management.  MSE signed by patient.  How Long Has This Been Causing You Problems? 1 wk - 1 month  Have You Recently Had Any Thoughts About Hurting Yourself? No  Are You Planning to Commit Suicide/Harm Yourself At This time? No  Have you Recently Had Thoughts About [place]? No  Are You Planning To Harm Someone At This Time? No  Are you currently experiencing any auditory, visual or other hallucinations? No  Have You Used Any Alcohol or Drugs in the Past 24 Hours? Yes  How long ago did you use Drugs or Alcohol? 24 hrs  What Did You Use and How Much? Cocaine  Do you have any current medical co-morbidities that require immediate attention? No  Clinician description of patient physical appearance/behavior: cooperative  What Do You Feel Would Help You the Most Today? Alcohol or Drug Use Treatment  If access to [facility] Ur[facility] was not available, would you have sought care in the Emergency Department? Yes  Determination of Need Routine (7 days)  Options For Referral Facility-Based Crisis

## 2023-01-27 NOTE — ED Notes (Signed)
Pt discharged with  AVS.  AVS reviewed prior to discharge.  Pt alert, oriented, and ambulatory.  Safety maintained.Dc'd to [facility] .Taken by safe transport to [facility]

## 2023-01-27 NOTE — Discharge Instructions (Addendum)
Writer coordinated with [facility] in [place] and spoke to [place].  Patient will be able to participate in their substance abuse program after her has been screened. Address: [address], [place], [place] [address] Hours: Open ? Closes 5?PM Phone: [phone]  Writer informed the NP, Otila Kluver working with the patient.   Patient is instructed prior to discharge to:  Take all medications as prescribed by his/her mental healthcare provider. Report any adverse effects and or reactions from the medicines to his/her outpatient provider promptly. Keep all scheduled appointments, to ensure that you are getting refills on time and to avoid any interruption in your medication.  If you are unable to keep an appointment call to reschedule.  Be sure to follow-up with resources and follow-up appointments provided.  Patient has been instructed & cautioned: To not engage in alcohol and or illegal drug use while on prescription medicines. In the event of worsening symptoms, patient is instructed to call the crisis hotline, 911 and or go to the nearest ED for appropriate evaluation and treatment of symptoms. To follow-up with his/her primary care provider for your other medical issues, concerns and or health care needs.  Information: -National Suicide Prevention Lifeline 1-800-SUICIDE or 2542324968.  -988 offers 24/7 access to trained crisis counselors who can help people experiencing mental health-related distress. People can call or text 988 or chat 988lifeline.org for themselves or if they are worried about a loved one who may need crisis support.

## 2023-01-27 NOTE — ED Notes (Signed)
Safe Transport called for transportation services to [facility] in [place].

## 2023-01-27 NOTE — ED Provider Notes (Signed)
Behavioral Health Urgent Care Medical Screening Exam  Patient Name: [name] MRN: [id number] Date of Evaluation: 01/27/23 Chief Complaint:  Cocaine Use Disorder Diagnosis:  Final diagnoses:  Cocaine use disorder ([place])    History of Present illness: [name] is a 62 y.o. male. Prefers to be called "[name]." Patient presents voluntarily to [facility] behavioral health for walk-in assessment.  Patient is transported by Event organiser, remains voluntary.   Patient is assessed, face-to-face, by nurse practitioner. He is seated in assessment area, no acute distress. Consulted with provider, Dr.  Dwyane Dee, and chart reviewed on 01/27/2023. He  is alert and oriented, pleasant and cooperative during assessment.   States "I just want to stop using cocaine."  Patient reports he typically uses cocaine approximately 2 times per month.  He typically uses between $400 and $500 worth of cocaine during each session.  Most recent cocaine use on last night.  He started using cocaine approximately 3 years ago.  He states "I only use cocaine when I have money."  Patient uses cocaine by smoking crack cocaine.  He has not sought residential substance use treatment previously.  He denies alcohol use, denies substance use aside from cocaine. Patient states "I just need my wife to know that I am getting help, she does not want me around the children when I am using."   Recent stressors include financial, he uses all of his money to purchase cocaine.  He also reports cocaine is caused a difficulty in his marriage.  Patient's wife does not approve when patient remains out all night or for days at that time.  Patient requests assistance with transportation as he does not have any way to residential substance use treatment.    [name] called [facility] earlier this date, spoke with [facility] who suggested he be evaluated at [facility] prior to admission.  He would like to be admitted to  [facility].   Patient denies personal mental health history.  He is not linked with outpatient psychiatry currently.  No current medications to address mood.  He reports he is prescribed medication to control blood pressure and is compliant with his medication, denies any other medications. He did not have access to his medication today, spent last night at the home of a friend.   He denies history of inpatient psychiatric treatment.  Denies family mental health and addiction history.  Patient  presents with euthymic mood, congruent affect. He  denies suicidal and homicidal ideations. Denies history of suicide attempts, denies history of non suicidal self-harm behavior.  Patient easily  contracts verbally for safety with this Probation officer.    Patient has normal speech and behavior.  He  denies auditory and visual hallucinations.  Patient is able to converse coherently with goal-directed thoughts and no distractibility or preoccupation.  Denies symptoms of paranoia currently. States "I have paranoia when I do cocaine, I feel like I can hear the police sometimes."  Objectively there is no evidence of psychosis/mania or delusional thinking.  Patient resides in [place] with his wife and stepdaughter. He denies access to weapons. 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  Patient offered support and encouragement. Reviewed plan for transport to [facility] for potential residential substance use treatment, patient verbalizes agreement and understanding.   [place] ED from 01/27/2023 in [facility] ED from 01/13/2023 in [facility] Emergency Department at [facility] ED from 10/21/2021 in  [place] Emergency Department at [place] No Risk No Risk No Risk       Psychiatric Specialty Exam  Presentation  General Appearance:Appropriate for Environment; Casual  Eye  Contact:Good  Speech:Clear and Coherent; Normal Rate  Speech Volume:Normal  Handedness:Right   Mood and Affect  Mood: Euthymic  Affect: Appropriate; Congruent   Thought Process  Thought Processes: Coherent; Goal Directed; Linear  Descriptions of Associations:Intact  Orientation:Full (Time, Place and Person)  Thought Content:Logical; WDL    Hallucinations:None  Ideas of Reference:None  Suicidal Thoughts:No  Homicidal Thoughts:No   Sensorium  Memory: Immediate Good; Recent Good  Judgment: Good  Insight: Fair   Executive Functions  Concentration: Good  Attention Span: Good  Recall: Good  Fund of Knowledge: Good  Language:No data recorded  Psychomotor Activity  Psychomotor Activity: Normal   Assets  Assets: Communication Skills; Desire for Improvement; Financial Resources/Insurance; Housing; Leisure Time; Physical Health; Resilience; Social Support   Sleep  Sleep: Fair  Number of hours: No data recorded  Physical Exam: Physical Exam Vitals and nursing note reviewed.  Constitutional:      General: He is not in acute distress.    Appearance: Normal appearance. He is well-developed.  HENT:     Head: Normocephalic and atraumatic.     Nose: Nose normal.  Cardiovascular:     Rate and Rhythm: Normal rate and regular rhythm.     Heart sounds: No murmur heard. Pulmonary:     Effort: Pulmonary effort is normal. No respiratory distress.     Breath sounds: Normal breath sounds.  Abdominal:     Tenderness: There is no abdominal tenderness.  Musculoskeletal:        General: No swelling. Normal range of motion.     Cervical back: Normal range of motion.  Skin:    General: Skin is warm and dry.  Neurological:     Mental Status: He is alert and oriented to person, place, and time.  Psychiatric:        Attention and Perception: Attention and perception normal.        Mood and Affect: Mood and affect normal.        Speech: Speech  normal.        Behavior: Behavior normal. Behavior is cooperative.        Thought Content: Thought content normal.        Cognition and Memory: Cognition normal.    Review of Systems  Constitutional: Negative.   HENT: Negative.    Eyes: Negative.   Respiratory: Negative.    Cardiovascular: Negative.   Gastrointestinal: Negative.   Genitourinary: Negative.   Musculoskeletal: Negative.   Skin: Negative.   Neurological: Negative.   Psychiatric/Behavioral:  Positive for substance abuse.    Blood pressure (!) 150/101, pulse 68, temperature 98.4 F (36.9 C), temperature source Oral, resp. rate 18, SpO2 100 %. There is no height or weight on file to calculate BMI.  Musculoskeletal: Strength & Muscle Tone: within normal limits Gait & Station: normal Patient leans: N/A   [place] MSE Discharge Disposition for Follow up and Recommendations: Based on my evaluation the patient does not appear to have an emergency medical condition and can be discharged with resources and follow up care in outpatient services for Individual Therapy Follow-up with substance use treatment resources provided. Sample mediation and transportation provided to [facility]. Follow-up with outpatient mental health resources provided.   Lucky Rathke, FNP 01/27/2023, 2:05 PM

## 2023-12-04 ENCOUNTER — Observation Stay (HOSPITAL_COMMUNITY)
Admission: EM | Admit: 2023-12-04 | Discharge: 2023-12-06 | Disposition: A | Discharge status: Home / Self Care | Payer: Self-pay | Attending: Internal Medicine | Admitting: Internal Medicine

## 2023-12-04 ENCOUNTER — Other Ambulatory Visit: Payer: Self-pay

## 2023-12-04 ENCOUNTER — Encounter (HOSPITAL_COMMUNITY): Payer: Self-pay | Admitting: *Deleted

## 2023-12-04 ENCOUNTER — Emergency Department (HOSPITAL_COMMUNITY): Payer: Self-pay

## 2023-12-04 DIAGNOSIS — R079 Chest pain, unspecified: Principal | ICD-10-CM | POA: Diagnosis present

## 2023-12-04 DIAGNOSIS — Z23 Encounter for immunization: Secondary | ICD-10-CM | POA: Insufficient documentation

## 2023-12-04 DIAGNOSIS — Z79899 Other long term (current) drug therapy: Secondary | ICD-10-CM | POA: Insufficient documentation

## 2023-12-04 DIAGNOSIS — I10 Essential (primary) hypertension: Secondary | ICD-10-CM | POA: Insufficient documentation

## 2023-12-04 DIAGNOSIS — R072 Precordial pain: Principal | ICD-10-CM | POA: Insufficient documentation

## 2023-12-04 DIAGNOSIS — I161 Hypertensive emergency: Secondary | ICD-10-CM | POA: Insufficient documentation

## 2023-12-04 DIAGNOSIS — Z760 Encounter for issue of repeat prescription: Secondary | ICD-10-CM

## 2023-12-04 DIAGNOSIS — G8929 Other chronic pain: Secondary | ICD-10-CM | POA: Insufficient documentation

## 2023-12-04 DIAGNOSIS — M545 Low back pain, unspecified: Secondary | ICD-10-CM | POA: Insufficient documentation

## 2023-12-04 LAB — HIV ANTIBODY (ROUTINE TESTING W REFLEX): HIV Screen 4th Generation wRfx: NONREACTIVE

## 2023-12-04 LAB — TROPONIN I (HIGH SENSITIVITY)
Troponin I (High Sensitivity): 33 ng/L — ABNORMAL HIGH (ref <18)
Troponin I (High Sensitivity): 44 ng/L — ABNORMAL HIGH (ref <18)

## 2023-12-04 LAB — BRAIN NATRIURETIC PEPTIDE: B Natriuretic Peptide: 43.8 pg/mL (ref 0.0–100.0)

## 2023-12-04 LAB — BASIC METABOLIC PANEL
Anion gap: 13 (ref 5–15)
BUN: 10 mg/dL (ref 6–20)
CO2: 22 mmol/L (ref 22–32)
Calcium: 9.2 mg/dL (ref 8.9–10.3)
Chloride: 101 mmol/L (ref 98–111)
Creatinine, Ser: 1.17 mg/dL (ref 0.61–1.24)
GFR, Estimated: >60 mL/min (ref >60)
Glucose, Bld: 99 mg/dL (ref 70–99)
Potassium: 3.6 mmol/L (ref 3.5–5.1)
Sodium: 136 mmol/L (ref 135–145)

## 2023-12-04 LAB — CBC
HCT: 50.3 % (ref 39.0–52.0)
Hemoglobin: 15.7 g/dL (ref 13.0–17.0)
MCH: 23.5 pg — ABNORMAL LOW (ref 26.0–34.0)
MCHC: 31.2 g/dL (ref 30.0–36.0)
MCV: 75.2 fL — ABNORMAL LOW (ref 80.0–100.0)
Platelets: 192 10*3/uL (ref 150–400)
RBC: 6.69 MIL/uL — ABNORMAL HIGH (ref 4.22–5.81)
RDW: 18.4 % — ABNORMAL HIGH (ref 11.5–15.5)
WBC: 12.7 10*3/uL — ABNORMAL HIGH (ref 4.0–10.5)
nRBC: 0 % (ref 0.0–0.2)

## 2023-12-04 MED ORDER — ACETAMINOPHEN 325 MG PO TABS: 650.0000 mg | ORAL_TABLET | ORAL | Status: DC | PRN

## 2023-12-04 MED ORDER — ONDANSETRON HCL 4 MG/2ML IJ SOLN: 4.0000 mg | Freq: Four times a day (QID) | INTRAMUSCULAR | Status: DC | PRN

## 2023-12-04 MED ORDER — AMLODIPINE BESYLATE 5 MG PO TABS
5.0000 mg | ORAL_TABLET | Freq: Every day | ORAL | Status: DC
  Administered 2023-12-04 – 2023-12-05 (×2): 5 mg via ORAL
  Filled 2023-12-04 (×2): qty 1

## 2023-12-04 MED ORDER — HEPARIN BOLUS VIA INFUSION
4000.0000 [IU] | Freq: Once | INTRAVENOUS | Status: AC
  Administered 2023-12-04: 4000 [IU] via INTRAVENOUS
  Filled 2023-12-04: qty 4000

## 2023-12-04 MED ORDER — HEPARIN (PORCINE) 25000 UT/250ML-% IV SOLN
1700.0000 [IU]/h | INTRAVENOUS | Status: DC
  Administered 2023-12-04: 1350 [IU]/h via INTRAVENOUS
  Filled 2023-12-04 (×2): qty 250

## 2023-12-04 MED ORDER — ASPIRIN 81 MG PO TBEC
81.0000 mg | DELAYED_RELEASE_TABLET | Freq: Every day | ORAL | Status: DC
  Administered 2023-12-04 – 2023-12-06 (×3): 81 mg via ORAL
  Filled 2023-12-04 (×3): qty 1

## 2023-12-04 MED ORDER — HYDRALAZINE HCL 20 MG/ML IJ SOLN: 10.0000 mg | Freq: Four times a day (QID) | INTRAMUSCULAR | Status: DC | PRN

## 2023-12-04 NOTE — Assessment & Plan Note (Addendum)
Initial BP 195/97, with chest pain potentially related.  Pt reports unable to afford his BP medication, has not been on it for about a year. --Amlodipine 5 mg daily --IV hydralazine PRN --Chest pain mgmt as outlined --TOC consult for medication assistance

## 2023-12-04 NOTE — ED Provider Notes (Signed)
[place] EMERGENCY DEPARTMENT AT [facility] Provider Note   CSN: [id number] Arrival date & time: 12/04/23  1615     History  Chief Complaint  Patient presents with   Back Pain   Chest Pain    [name] is a 62 y.o. male.  62 year old male with past medical history of HTN who has been off of his antihypertensives for the last 1 year due to cost presents here for concerns of chest pain that woke him from sleep at around 6 AM.  Pain located in the central portion of his chest.  He describes it as a sharp sensation.  Nonradiating.  No associated shortness of breath, palpitations, nausea, vomiting, diaphoresis, lightheadedness, dizziness, headache, changes in vision.  Patient took some aspirin at the onset of his chest pain, which resolved at.  Pain returned approximately 1 hour later, which prompted him to come to the hospital for evaluation.  However, on arrival, his chest pain had resolved.  Therefore, patient went home.  Shortly after returning home, he had a third episode of chest pain, which prompted him to come for evaluation.  Patient states that he has never had pain like this in the past.  Triage nursing note does mention back pain.  Patient states that he was having his chronic back pain yesterday that he took Tylenol for.  He has not been having any back pain today.  Patient states that his pain yesterday was not any different than his typical back pain.        Home Medications Prior to Admission medications   Medication Sig Start Date End Date Taking? Authorizing Provider  amLODipine (NORVASC) 10 MG tablet Take 1 tablet (10 mg total) by mouth daily. 01/13/23   Redwine, Madison A, PA-C      Allergies    Patient has no known allergies.    Review of Systems   As noted in HPI  Physical Exam Updated Vital Signs BP (!) 195/97 (BP Location: Right Arm)   Pulse 72   Temp 98.1 F (36.7 C)   Resp 14   Ht 6' (1.829 m)   Wt 131.5 kg   SpO2 95%   BMI 39.32  kg/m  Physical Exam Vitals reviewed.  Constitutional:      General: He is not in acute distress.    Appearance: Normal appearance. He is obese. He is not ill-appearing, toxic-appearing or diaphoretic.  HENT:     Head: Normocephalic.     Mouth/Throat:     Mouth: Mucous membranes are moist.  Eyes:     Conjunctiva/sclera: Conjunctivae normal.  Cardiovascular:     Rate and Rhythm: Normal rate and regular rhythm.     Pulses:          Radial pulses are 2+ on the right side and 2+ on the left side.       Dorsalis pedis pulses are 2+ on the right side and 2+ on the left side.     Heart sounds: Normal heart sounds. No murmur heard.    No friction rub. No gallop.  Pulmonary:     Effort: Pulmonary effort is normal. No respiratory distress.     Breath sounds: Normal breath sounds. No wheezing, rhonchi or rales.  Abdominal:     General: There is no distension.     Palpations: Abdomen is soft.     Tenderness: There is no abdominal tenderness. There is no guarding or rebound.     Hernia: A hernia (Umbilical,  reducible, nontender) is present.  Musculoskeletal:     Right lower leg: No edema.     Left lower leg: No edema.  Skin:    General: Skin is warm and dry.  Neurological:     Mental Status: He is alert.     ED Results / Procedures / Treatments   Labs (all labs ordered are listed, but only abnormal results are displayed) Labs Reviewed  BASIC METABOLIC PANEL  CBC  TROPONIN I (HIGH SENSITIVITY)    EKG None  Radiology No results found.  Procedures Procedures    Medications Ordered in ED Medications - No data to display  ED Course/ Medical Decision Making/ A&P Clinical Course as of 12/04/23 1822  Sat Dec 04, 2023  1813 DG Chest 2 View Independently reviewed patient's image.  No focal opacities.  No cardiomegaly or mediastinal widening.  No pneumothorax. [JR]  1813 ED EKG Sinus rhythm.  Rate of 69.  Prolonged PR and QRS.  Left axis deviation. [JR]    Clinical Course  User Index [JR] Rolla Flatten, MD                                 Medical Decision Making Amount and/or Complexity of Data Reviewed Labs: ordered. Radiology: ordered and independent interpretation performed. Decision-making details documented in ED Course. ECG/medicine tests: ordered and independent interpretation performed. Decision-making details documented in ED Course.  Risk Decision regarding hospitalization.   62 year old male presents here for acute onset chest pain.  Noted to be hypertensive on presentation.  On exam, patient is in no acute distress.  He is nontoxic-appearing.  Cardiopulmonary exam unremarkable.  Patient has 2+ peripheral pulses in bilateral upper and lower extremities.  No lower extremity edema noted.  Differential diagnosis includes ACS, pneumothorax, aortic dissection, electrolyte abnormality, hypertensive urgency, hypertensive emergency.  I independently reviewed this patient's ECG, which is overall nonischemic.  I independently reviewed the patient's imaging.  Chest x-ray is unremarkable.  No evidence of pneumothorax.  Labs, including BMP, CBC, troponin, BNP ordered for evaluation.  Independently reviewed the patient's labs, which are notable for leukocytosis of 12.7 and elevated troponins of 33 and 44 respectively.  Patient was hypertensive on arrival with systolic blood pressure of 195.  However, this has improved without intervention here.  Laboratory workup concerning for ACS versus hypertensive emergency.  Feel the patient would benefit from hospital admission for further cardiac workup.  Patient was discussed with hospitalist, Dr. Denton Lank, who has accepted him for admission.  Patient's presentation is most consistent with acute presentation with potential threat to life or bodily function.         Final Clinical Impression(s) / ED Diagnoses Final diagnoses:  Chest pain, unspecified type    Rx / DC Orders ED Discharge Orders     None          Rolla Flatten, MD 12/04/23 2359    Tegeler, Canary Brim, MD 12/05/23 1553

## 2023-12-04 NOTE — H&P (Signed)
History and Physical    Patient: [name] ZOX:[id number] DOB: 01-21-1961 DOA: 12/04/2023 DOS: the patient was seen and examined on 12/04/2023 PCP: Grayce Sessions, NP  Patient coming from: Home  Chief Complaint:  Chief Complaint  Patient presents with   Back Pain   Chest Pain   HPI: [name] is a 62 y.o. male with medical history significant of hypertension not on medications due to cost for about one year, presented to the ED for evaluation of recurrent episodes of chest pain.  He reports initial episode woke him up from sleep around 6:00 AM today.   Pain was located central, substernal, described as sharp in nature.  He denies associated nausea/vomiting, diaphoresis, dyspnea, palpitations, dizziness or lightheadedness.  No recent illnesses or sick contacts.  Denies personal or family history of coronary disease or heart attacks. His mother had a pacemaker.  Pt reports taking aspirin at home and chest pain resolved, but later it recurred.  He came to the ER to be seen, but returned home after chest pain again resolved.  Once at home, chest pain recurred again, prompting him to return to the ER for evaluation.  He reports chronic intermittent low back pain, no recent injury or increase in pain.  Upon admission encounter, patient is chest pain free.  ED course --  Initial vitals BP 195/97, HR 72, temp 98.1 F, spO2 95% on room air, RR 14. Labs --- normal BMP.  BNP 43.8.  Hs troponin mildly elevated at 30 >> 44 on repeat.  CBC with WBC 12.7k. Chest xray -- no acute findings. EKG - normal sinus rhythm 69 bpm, 1st degree AV block, no ST elevation or acute ischemic changes.  Pt is admitted for observation and further evaluation and management of chest pain suspected due to hypertensive emergency versus ACS.  Started on IV heparin empirically while cardiac enzymes are monitored.    Review of Systems: As mentioned in the history of present illness. All other systems reviewed and  are negative.   Past Medical History:  Diagnosis Date   Hypertension    Obesity    History reviewed. No pertinent surgical history.  Social History:  reports that he has never smoked. He has never used smokeless tobacco. He reports current alcohol use. He reports that he does not use drugs.  Pt denies recreational or illicit drug use.   Note chart reviewed -- [facility] from 01/27/2023 reviewed -- pt presented at that time with complaint of "I just want to stop using cocaine"   No Known Allergies   Family history -- mother had a pacemaker   Prior to Admission medications   Medication Sig Start Date End Date Taking? Authorizing Provider  amLODipine (NORVASC) 10 MG tablet Take 1 tablet (10 mg total) by mouth daily. 01/13/23   Redwine, Gabriel Cirri, PA-C    Physical Exam: Vitals:   12/04/23 1934 12/04/23 1949 12/04/23 2000 12/04/23 2018  BP:  (!) 159/95 (!) 182/86 (!) 159/91  Pulse: (!) 114 67 67 70  Resp: 16 17 18 14   Temp:      SpO2: 98% 98% 98% 97%  Weight:      Height:       General exam: awake, alert, no acute distress HEENT: atraumatic, clear conjunctiva, anicteric sclera, moist mucus membranes, hearing grossly normal  Respiratory system: CTAB, no wheezes, rales or rhonchi, normal respiratory effort. Cardiovascular system: normal S1/S2, RRR, no JVD, murmurs, rubs, gallops, trace BLE edema.  Gastrointestinal system: soft, NT, ND, no HSM felt, +bowel sounds. Central nervous system: A&O x 3. no gross focal neurologic deficits, normal speech Extremities: moves all, trace BLE edema, normal tone Skin: dry, intact, normal temperature Psychiatry: normal mood, congruent affect, judgement and insight appear normal  Data Reviewed:  As reviewed above in  ED course  Assessment and Plan: * Chest pain Suspect due to hypertensive emergency versus less likely ACS given pain is intermittent as has improved with BP control in the ED.   --IV heparin  empirically --Trend troponins --Stat EKG and repeat troponin if recurrent chest pain --Daily ASA 81 mg --Echocardiogram --NPO after midnight in case of stress test or cath --Cardiology consult in AM as appropriate --BP control --Lipid panel with AM labs --?Cocaine history per chart review, will avoid beta blockers for now. Check UDS.  Hypertensive emergency Initial BP 195/97, with chest pain potentially related.  Pt reports unable to afford his BP medication, has not been on it for about a year. --Amlodipine 5 mg daily --IV hydralazine PRN --Chest pain mgmt as outlined --TOC consult for medication assistance      Advance Care Planning: Code status -- full code Pt confirmed wish to be full code in event of cardiac or respiratory arrest, would want any and all measures taken for resuscitation.    Consults: None  Family Communication: None present. Pt able to update.  Severity of Illness: The appropriate patient status for this patient is OBSERVATION. Observation status is judged to be reasonable and necessary in order to provide the required intensity of service to ensure the patient's safety. The patient's presenting symptoms, physical exam findings, and initial radiographic and laboratory data in the context of their medical condition is felt to place them at decreased risk for further clinical deterioration. Furthermore, it is anticipated that the patient will be medically stable for discharge from the hospital within 2 midnights of admission.   Author: Pennie Banter, DO 12/04/2023 9:05 PM  For on call review [email or web address].

## 2023-12-04 NOTE — ED Triage Notes (Signed)
The pt had chest pain last night and some low back pain since yesterday   no known injury no sob no nausea

## 2023-12-04 NOTE — Progress Notes (Signed)
ANTICOAGULATION CONSULT NOTE - Initial Consult  Pharmacy Consult for Heparin Indication: chest pain/ACS  No Known Allergies  Patient Measurements: Height: 6' (182.9 cm) Weight: 131.5 kg (289 lb 14.5 oz) IBW/kg (Calculated) : 77.6 Heparin Dosing Weight: 107.4 kg  Vital Signs: Temp: 98.1 F (36.7 C) (12/07 1635) BP: 159/91 (12/07 2018) Pulse Rate: 70 (12/07 2018)  Labs: Recent Labs    12/04/23 1701 12/04/23 1850  HGB 15.7  --   HCT 50.3  --   PLT 192  --   CREATININE 1.17  --   TROPONINIHS 33* 44*    Estimated Creatinine Clearance: 94.2 mL/min (by C-G formula based on SCr of 1.17 mg/dL).   Medical History: Past Medical History:  Diagnosis Date   Hypertension    Obesity     Medications:  (Not in a hospital admission)  Scheduled:   aspirin EC  81 mg Oral Daily   Infusions:  PRN: acetaminophen, hydrALAZINE, ondansetron (ZOFRAN) IV  Assessment: 62 yom with a history of htn. Patient is presenting with chest pain. Heparin per pharmacy consult placed for chest pain/ACS.  Patient is not on anticoagulation prior to arrival.  Hgb 15.7; plt 192  Goal of Therapy:  Heparin level 0.3-0.7 units/ml Monitor platelets by anticoagulation protocol: Yes   Plan:  Give IV heparin 4000 units bolus x 1 Start heparin infusion at 1350 units/hr Check anti-Xa level in 6 hours and daily while on heparin Continue to monitor H&H and platelets  Delmar Landau, PharmD, BCPS 12/04/2023 8:41 PM ED Clinical Pharmacist -  [phone]

## 2023-12-04 NOTE — Assessment & Plan Note (Addendum)
Suspect due to hypertensive emergency versus less likely ACS given pain is intermittent as has improved with BP control in the ED.   --IV heparin empirically --Trend troponins --Stat EKG and repeat troponin if recurrent chest pain --Daily ASA 81 mg --Echocardiogram --NPO after midnight in case of stress test or cath --Cardiology consult in AM as appropriate --BP control --Lipid panel with AM labs --?Cocaine history per chart review, will avoid beta blockers for now. Check UDS.

## 2023-12-05 ENCOUNTER — Observation Stay (HOSPITAL_BASED_OUTPATIENT_CLINIC_OR_DEPARTMENT_OTHER): Payer: Self-pay

## 2023-12-05 ENCOUNTER — Encounter (HOSPITAL_COMMUNITY): Payer: Self-pay | Admitting: Internal Medicine

## 2023-12-05 DIAGNOSIS — R079 Chest pain, unspecified: Secondary | ICD-10-CM

## 2023-12-05 DIAGNOSIS — I200 Unstable angina: Secondary | ICD-10-CM

## 2023-12-05 LAB — ECHOCARDIOGRAM COMPLETE
AR max vel: 3.54 cm2
AV Area VTI: 3.67 cm2
AV Area mean vel: 3.42 cm2
AV Mean grad: 3 mm[Hg]
AV Peak grad: 5.9 mm[Hg]
Ao pk vel: 1.21 m/s
Area-P 1/2: 3.16 cm2
Height: 72 in
S' Lateral: 3.6 cm
Weight: 4638.48 [oz_av]

## 2023-12-05 LAB — CBC
HCT: 40.7 % (ref 39.0–52.0)
Hemoglobin: 12.8 g/dL — ABNORMAL LOW (ref 13.0–17.0)
MCH: 23.7 pg — ABNORMAL LOW (ref 26.0–34.0)
MCHC: 31.4 g/dL (ref 30.0–36.0)
MCV: 75.4 fL — ABNORMAL LOW (ref 80.0–100.0)
Platelets: 180 10*3/uL (ref 150–400)
RBC: 5.4 MIL/uL (ref 4.22–5.81)
RDW: 17 % — ABNORMAL HIGH (ref 11.5–15.5)
WBC: 7.8 10*3/uL (ref 4.0–10.5)
nRBC: 0 % (ref 0.0–0.2)

## 2023-12-05 LAB — LIPID PANEL
Cholesterol: 191 mg/dL (ref 0–200)
HDL: 42 mg/dL (ref >40)
LDL Cholesterol: 133 mg/dL — ABNORMAL HIGH (ref 0–99)
Total CHOL/HDL Ratio: 4.5 {ratio}
Triglycerides: 81 mg/dL (ref <150)
VLDL: 16 mg/dL (ref 0–40)

## 2023-12-05 LAB — HEPARIN LEVEL (UNFRACTIONATED): Heparin Unfractionated: <0.1 [IU]/mL — ABNORMAL LOW (ref 0.30–0.70)

## 2023-12-05 LAB — TROPONIN I (HIGH SENSITIVITY): Troponin I (High Sensitivity): 38 ng/L — ABNORMAL HIGH (ref <18)

## 2023-12-05 LAB — RAPID URINE DRUG SCREEN, HOSP PERFORMED
Amphetamines: NOT DETECTED
Barbiturates: NOT DETECTED
Benzodiazepines: NOT DETECTED
Cocaine: POSITIVE — AB
Opiates: NOT DETECTED
Tetrahydrocannabinol: POSITIVE — AB

## 2023-12-05 LAB — HEMOGLOBIN A1C
Hgb A1c MFr Bld: 5.6 % (ref 4.8–5.6)
Mean Plasma Glucose: 114.02 mg/dL

## 2023-12-05 MED ORDER — TRAZODONE HCL 50 MG PO TABS: 50.0000 mg | ORAL_TABLET | Freq: Every evening | ORAL | Status: DC | PRN

## 2023-12-05 MED ORDER — PERFLUTREN LIPID MICROSPHERE
3.0000 mL | INTRAVENOUS | Status: DC | PRN
  Administered 2023-12-05: 3 mL via INTRAVENOUS
  Filled 2023-12-05: qty 4

## 2023-12-05 MED ORDER — ATORVASTATIN CALCIUM 40 MG PO TABS
40.0000 mg | ORAL_TABLET | Freq: Every day | ORAL | Status: DC
  Administered 2023-12-05 – 2023-12-06 (×2): 40 mg via ORAL
  Filled 2023-12-05 (×2): qty 1

## 2023-12-05 MED ORDER — HYDRALAZINE HCL 20 MG/ML IJ SOLN: 10.0000 mg | INTRAMUSCULAR | Status: DC | PRN

## 2023-12-05 MED ORDER — SENNOSIDES-DOCUSATE SODIUM 8.6-50 MG PO TABS: 1.0000 | ORAL_TABLET | Freq: Every evening | ORAL | Status: DC | PRN

## 2023-12-05 MED ORDER — IPRATROPIUM-ALBUTEROL 0.5-2.5 (3) MG/3ML IN SOLN: 3.0000 mL | RESPIRATORY_TRACT | Status: DC | PRN

## 2023-12-05 MED ORDER — INFLUENZA VIRUS VACC SPLIT PF (FLUZONE) 0.5 ML IM SUSY
0.5000 mL | PREFILLED_SYRINGE | INTRAMUSCULAR | Status: AC
  Administered 2023-12-06: 0.5 mL via INTRAMUSCULAR
  Filled 2023-12-05: qty 0.5

## 2023-12-05 MED ORDER — ENOXAPARIN SODIUM 40 MG/0.4ML IJ SOSY
40.0000 mg | PREFILLED_SYRINGE | INTRAMUSCULAR | Status: DC
  Administered 2023-12-05: 40 mg via SUBCUTANEOUS
  Filled 2023-12-05: qty 0.4

## 2023-12-05 MED ORDER — GUAIFENESIN 100 MG/5ML PO LIQD: 5.0000 mL | ORAL | Status: DC | PRN

## 2023-12-05 NOTE — Hospital Course (Addendum)
Brief Narrative:   62 year old with history of HTN not on medications due to cost comes to the ED with complaints of chest pain.  Upon admission noted to have significantly elevated blood pressure with systolic in 190s, EKG did not show any acute ST-T changes.  Admitted for further management.  Assessment & Plan:  Principal Problem:   Chest pain Active Problems:   Hypertensive emergency    Chest pain Hypertensive emergency; improved Cocaine Positive EKG does not show any acute ST-T changes.  Initial blood pressure into 190s.  Echocardiogram ordered.  Troponin 33 > 44 > 38.  LDL 133.  UDS positive for cocaine and THC.  Avoid any beta-blocker use.  Currently started on Norvasc 5 mg daily and aspirin.  Will also start him on statin.  Order echocardiogram. Stop heparin drip  DVT prophylaxis: Lovenox Code Status: Full code Family Communication:   Needs better blood pressure control over next 24 hours.  Monitor for any chest pain especially with ambulation.    Subjective: Still having some exertional symptoms at times.  Denies any other chest pain at this time. Reports of using cocaine about once a week or so.  Examination:  General exam: Appears calm and comfortable  Respiratory system: Clear to auscultation. Respiratory effort normal. Cardiovascular system: S1 & S2 heard, RRR. No JVD, murmurs, rubs, gallops or clicks. No pedal edema. Gastrointestinal system: Abdomen is nondistended, soft and nontender. No organomegaly or masses felt. Normal bowel sounds heard. Central nervous system: Alert and oriented. No focal neurological deficits. Extremities: Symmetric 5 x 5 power. Skin: No rashes, lesions or ulcers Psychiatry: Judgement and insight appear normal. Mood & affect appropriate.

## 2023-12-05 NOTE — Progress Notes (Signed)
PROGRESS NOTE    [name]  ZOX:[id number] DOB: [birth year] DOA: 12/04/2023 PCP: Grayce Sessions, NP    Brief Narrative:   62 year old with history of HTN not on medications due to cost comes to the ED with complaints of chest pain.  Upon admission noted to have significantly elevated blood pressure with systolic in 190s, EKG did not show any acute ST-T changes.  Admitted for further management.  Assessment & Plan:  Principal Problem:   Chest pain Active Problems:   Hypertensive emergency    Chest pain Hypertensive emergency; improved Cocaine Positive EKG does not show any acute ST-T changes.  Initial blood pressure into 190s.  Echocardiogram ordered.  Troponin 33 > 44 > 38.  LDL 133.  UDS positive for cocaine and THC.  Avoid any beta-blocker use.  Currently started on Norvasc 5 mg daily and aspirin.  Will also start him on statin.  Order echocardiogram. Stop heparin drip  DVT prophylaxis: Lovenox Code Status: Full code Family Communication:   Needs better blood pressure control over next 24 hours.  Monitor for any chest pain especially with ambulation.    Subjective: Still having some exertional symptoms at times.  Denies any other chest pain at this time. Reports of using cocaine about once a week or so.  Examination:  General exam: Appears calm and comfortable  Respiratory system: Clear to auscultation. Respiratory effort normal. Cardiovascular system: S1 & S2 heard, RRR. No JVD, murmurs, rubs, gallops or clicks. No pedal edema. Gastrointestinal system: Abdomen is nondistended, soft and nontender. No organomegaly or masses felt. Normal bowel sounds heard. Central nervous system: Alert and oriented. No focal neurological deficits. Extremities: Symmetric 5 x 5 power. Skin: No rashes, lesions or ulcers Psychiatry: Judgement and insight appear normal. Mood & affect appropriate.         Diet Orders (From admission, onward)     Start     Ordered   12/05/23  1140  Diet 2 gram sodium Room service appropriate? Yes; Fluid consistency: Thin  Diet effective now       Question Answer Comment  Room service appropriate? Yes   Fluid consistency: Thin      12/05/23 1139            Objective: Vitals:   12/05/23 0825 12/05/23 0842 12/05/23 0900 12/05/23 1000  BP:   (!) 115/97 127/87  Pulse: (!) 58  91 (!) 39  Resp: 16  14 19   Temp:  98 F (36.7 C)    TempSrc:  Oral    SpO2: 96%  98% 97%  Weight:      Height:       No intake or output data in the 24 hours ending 12/05/23 1141 Filed Weights   12/04/23 1645  Weight: 131.5 kg    Scheduled Meds:  amLODipine  5 mg Oral Daily   aspirin EC  81 mg Oral Daily   atorvastatin  40 mg Oral Daily   enoxaparin (LOVENOX) injection  40 mg Subcutaneous Q24H   Continuous Infusions:  Nutritional status     Body mass index is 39.32 kg/m.  Data Reviewed:   CBC: Recent Labs  Lab 12/04/23 1701  WBC 12.7*  HGB 15.7  HCT 50.3  MCV 75.2*  PLT 192   Basic Metabolic Panel: Recent Labs  Lab 12/04/23 1701  NA 136  K 3.6  CL 101  CO2 22  GLUCOSE 99  BUN 10  CREATININE 1.17  CALCIUM 9.2   GFR: Estimated Creatinine  Clearance: 94.2 mL/min (by C-G formula based on SCr of 1.17 mg/dL). Liver Function Tests: No results for input(s): "AST", "ALT", "ALKPHOS", "BILITOT", "PROT", "ALBUMIN" in the last 168 hours. No results for input(s): "LIPASE", "AMYLASE" in the last 168 hours. No results for input(s): "AMMONIA" in the last 168 hours. Coagulation Profile: No results for input(s): "INR", "PROTIME" in the last 168 hours. Cardiac Enzymes: No results for input(s): "CKTOTAL", "CKMB", "CKMBINDEX", "TROPONINI" in the last 168 hours. BNP (last 3 results) No results for input(s): "PROBNP" in the last 8760 hours. HbA1C: No results for input(s): "HGBA1C" in the last 72 hours. CBG: No results for input(s): "GLUCAP" in the last 168 hours. Lipid Profile: Recent Labs    12/05/23 0056  CHOL 191  HDL  42  LDLCALC 133*  TRIG 81  CHOLHDL 4.5   Thyroid Function Tests: No results for input(s): "TSH", "T4TOTAL", "FREET4", "T3FREE", "THYROIDAB" in the last 72 hours. Anemia Panel: No results for input(s): "VITAMINB12", "FOLATE", "FERRITIN", "TIBC", "IRON", "RETICCTPCT" in the last 72 hours. Sepsis Labs: No results for input(s): "PROCALCITON", "LATICACIDVEN" in the last 168 hours.  No results found for this or any previous visit (from the past 240 hour(s)).       Radiology Studies: ECHOCARDIOGRAM COMPLETE  Result Date: 12/05/2023    ECHOCARDIOGRAM REPORT   Patient Name:   [name] [facility] Date of Exam: 12/05/2023 Medical Rec #:  [id number]        Height:       72.0 in Accession #:    [id number]       Weight:       289.9 lb Date of Birth:  05-05-1961         BSA:          2.495 m Patient Age:    62 years         BP:           121/63 mmHg Patient Gender: M                HR:           58 bpm. Exam Location:  Inpatient Procedure: 2D Echo, Color Doppler, Cardiac Doppler and Intracardiac            Opacification Agent Indications:    Chest Pain  History:        Patient has no prior history of Echocardiogram examinations.                 Signs/Symptoms:Chest Pain; Risk Factors:Hypertension.  Sonographer:    Milbert Coulter Referring Phys: [id number] KELLY A GRIFFITH  Sonographer Comments: Suboptimal apical window, patient is obese and no subcostal window. Image acquisition challenging due to patient body habitus. IMPRESSIONS  1. Left ventricular ejection fraction, by estimation, is 55 to 60%. The left ventricle has normal function. The left ventricle has no regional wall motion abnormalities. There is severe concentric left ventricular hypertrophy. Left ventricular diastolic  parameters are consistent with Grade I diastolic dysfunction (impaired relaxation).  2. Right ventricular systolic function was not well visualized. The right ventricular size is not well visualized.  3. A small pericardial effusion is  present.  4. No evidence of mitral valve regurgitation.  5. Aortic valve regurgitation is not visualized.  6. IVC not visualized. FINDINGS  Left Ventricle: Left ventricular ejection fraction, by estimation, is 55 to 60%. The left ventricle has normal function. The left ventricle has no regional wall motion abnormalities. Definity contrast agent was given IV to delineate the  left ventricular  endocardial borders. The left ventricular internal cavity size was normal in size. There is severe concentric left ventricular hypertrophy. Left ventricular diastolic parameters are consistent with Grade I diastolic dysfunction (impaired relaxation). Right Ventricle: The right ventricular size is not well visualized. Right ventricular systolic function was not well visualized. Left Atrium: Left atrial size was not well visualized. Right Atrium: Right atrial size was not well visualized. Pericardium: A small pericardial effusion is present. Mitral Valve: No evidence of mitral valve regurgitation. Tricuspid Valve: Tricuspid valve regurgitation is not demonstrated. Aortic Valve: Aortic valve regurgitation is not visualized. Aortic valve mean gradient measures 3.0 mmHg. Aortic valve peak gradient measures 5.9 mmHg. Aortic valve area, by VTI measures 3.67 cm. Pulmonic Valve: Pulmonic valve regurgitation is mild. Aorta: The aortic root and ascending aorta are structurally normal, with no evidence of dilitation. Venous: IVC not visualized. IAS/Shunts: The interatrial septum was not well visualized.  LEFT VENTRICLE PLAX 2D LVIDd:         5.00 cm   Diastology LVIDs:         3.60 cm   LV e' medial:    9.36 cm/s LV PW:         1.60 cm   LV E/e' medial:  6.0 LV IVS:        1.60 cm   LV e' lateral:   7.62 cm/s LVOT diam:     2.40 cm   LV E/e' lateral: 7.4 LV SV:         84 LV SV Index:   34 LVOT Area:     4.52 cm  LEFT ATRIUM         Index LA diam:    4.20 cm 1.68 cm/m  AORTIC VALVE AV Area (Vmax):    3.54 cm AV Area (Vmean):   3.42 cm  AV Area (VTI):     3.67 cm AV Vmax:           121.00 cm/s AV Vmean:          82.700 cm/s AV VTI:            0.229 m AV Peak Grad:      5.9 mmHg AV Mean Grad:      3.0 mmHg LVOT Vmax:         94.70 cm/s LVOT Vmean:        62.500 cm/s LVOT VTI:          0.186 m LVOT/AV VTI ratio: 0.81  AORTA Ao Root diam: 4.10 cm Ao Asc diam:  3.50 cm MITRAL VALVE MV Area (PHT): 3.16 cm    SHUNTS MV Decel Time: 240 msec    Systemic VTI:  0.19 m MV E velocity: 56.50 cm/s  Systemic Diam: 2.40 cm MV A velocity: 67.10 cm/s MV E/A ratio:  0.84 [place] signed by Carolan Clines Signature Date/Time: 12/05/2023/11:18:29 AM    Final    DG Chest 2 View  Result Date: 12/04/2023 CLINICAL DATA:  Chest pain yesterday EXAM: CHEST - 2 VIEW COMPARISON:  09/24/2017 FINDINGS: The heart size and mediastinal contours are within normal limits. Both lungs are clear. The visualized skeletal structures are unremarkable. IMPRESSION: No active cardiopulmonary disease. Electronically Signed   By: Sharlet Salina M.D.   On: 12/04/2023 17:35           LOS: 0 days   Time spent= 35 mins    Miguel Rota, MD [facility]  If 7PM-7AM, please contact night-coverage  12/05/2023,  11:41 AM

## 2023-12-05 NOTE — ED Notes (Signed)
ED TO INPATIENT HANDOFF REPORT  ED Nurse Name and Phone #: Berna Spare [phone]  S Name/Age/Gender [name] 62 y.o. male Room/Bed: 043C/043C  Code Status   Code Status: Full Code  Home/SNF/Other Home Patient oriented to: self, place, time, and situation Is this baseline? Yes   Triage Complete: Triage complete  Chief Complaint Chest pain [R07.9]  Triage Note The pt had chest pain last night and some low back pain since yesterday   no known injury no sob no nausea   Allergies No Known Allergies  Level of Care/Admitting Diagnosis ED Disposition     ED Disposition  Admit   Condition  --   Comment  Hospital Area: [facility] [[id number]]  Level of Care: Telemetry Cardiac [[id number]]  May place patient in observation at [facility] or [facility] if equivalent level of care is available:: Yes  Covid Evaluation: Asymptomatic - no recent exposure (last 10 days) testing not required  Diagnosis: Chest pain [981191]  Admitting Physician: Pennie Banter [[id number]]  Attending Physician: Pennie Banter [[id number]]          B Medical/Surgery History Past Medical History:  Diagnosis Date   Hypertension    Obesity    History reviewed. No pertinent surgical history.   A IV Location/Drains/Wounds Patient Lines/Drains/Airways Status     Active Line/Drains/Airways     Name Placement date Placement time Site Days   Peripheral IV 12/04/23 20 G Anterior;Distal;Right;Upper Arm 12/04/23  1902  Arm  1   Peripheral IV 12/05/23 22 G 1" Posterior;Right Hand 12/05/23  0344  Hand  less than 1            Intake/Output Last 24 hours No intake or output data in the 24 hours ending 12/05/23 1527  Labs/Imaging Results for orders placed or performed during the hospital encounter of 12/04/23 (from the past 48 hour(s))  Basic metabolic panel     Status: None   Collection Time: 12/04/23  5:01 PM  Result Value Ref Range   Sodium 136 135 - 145 mmol/L   Potassium  3.6 3.5 - 5.1 mmol/L   Chloride 101 98 - 111 mmol/L   CO2 22 22 - 32 mmol/L   Glucose, Bld 99 70 - 99 mg/dL    Comment: Glucose reference range applies only to samples taken after fasting for at least 8 hours.   BUN 10 6 - 20 mg/dL   Creatinine, Ser 7.84 0.61 - 1.24 mg/dL   Calcium 9.2 8.9 - 69.6 mg/dL   GFR, Estimated >29 >52 mL/min    Comment: (NOTE) Calculated using the CKD-EPI Creatinine Equation (2021)    Anion gap 13 5 - 15    Comment: Performed at [facility], [address] [place], [place] [address]  CBC     Status: Abnormal   Collection Time: 12/04/23  5:01 PM  Result Value Ref Range   WBC 12.7 (H) 4.0 - 10.5 K/uL   RBC 6.69 (H) 4.22 - 5.81 MIL/uL   Hemoglobin 15.7 13.0 - 17.0 g/dL   HCT 44.0 10.2 - 72.5 %   MCV 75.2 (L) 80.0 - 100.0 fL   MCH 23.5 (L) 26.0 - 34.0 pg   MCHC 31.2 30.0 - 36.0 g/dL   RDW 36.6 (H) 44.0 - 34.7 %   Platelets 192 150 - 400 K/uL   nRBC 0.0 0.0 - 0.2 %    Comment: Performed at [facility], [address], [place], [place]  [address]  Troponin I (High Sensitivity)     Status: Abnormal   Collection Time: 12/04/23  5:01 PM  Result Value Ref Range   Troponin I (High Sensitivity) 33 (H) <18 ng/L    Comment: (NOTE) Elevated high sensitivity troponin I (hsTnI) values and significant  changes across serial measurements may suggest ACS but many other  chronic and acute conditions are known to elevate hsTnI results.  Refer to the "Links" section for chest pain algorithms and additional  guidance. Performed at [facility], [address], [place], [place] [address]   Brain natriuretic peptide     Status: None   Collection Time: 12/04/23  5:01 PM  Result Value Ref Range   B Natriuretic Peptide 43.8 0.0 - 100.0 pg/mL    Comment: Performed at [facility], [address] [place], [place] [address]  Troponin I (High Sensitivity)     Status: Abnormal   Collection Time: 12/04/23  6:50 PM  Result Value Ref Range   Troponin I  (High Sensitivity) 44 (H) <18 ng/L    Comment: (NOTE) Elevated high sensitivity troponin I (hsTnI) values and significant  changes across serial measurements may suggest ACS but many other  chronic and acute conditions are known to elevate hsTnI results.  Refer to the "Links" section for chest pain algorithms and additional  guidance. Performed at [facility], [address] [place], [place] [address]   HIV Antibody (routine testing w rflx)     Status: None   Collection Time: 12/04/23  8:55 PM  Result Value Ref Range   HIV Screen 4th Generation wRfx Non Reactive Non Reactive    Comment: Performed at [facility], [address] [place], [place] [address]  Rapid urine drug screen (hospital performed)     Status: Abnormal   Collection Time: 12/04/23 11:02 PM  Result Value Ref Range   Opiates NONE DETECTED NONE DETECTED   Cocaine POSITIVE (A) NONE DETECTED   Benzodiazepines NONE DETECTED NONE DETECTED   Amphetamines NONE DETECTED NONE DETECTED   Tetrahydrocannabinol POSITIVE (A) NONE DETECTED   Barbiturates NONE DETECTED NONE DETECTED    Comment: (NOTE) DRUG SCREEN FOR MEDICAL PURPOSES ONLY.  IF CONFIRMATION IS NEEDED FOR ANY PURPOSE, NOTIFY LAB WITHIN 5 DAYS.  LOWEST DETECTABLE LIMITS FOR URINE DRUG SCREEN Drug Class                     Cutoff (ng/mL) Amphetamine and metabolites    1000 Barbiturate and metabolites    200 Benzodiazepine                 200 Opiates and metabolites        300 Cocaine and metabolites        300 THC                            50 Performed at [facility], [address], [place], [place] [address]   Lipid panel     Status: Abnormal   Collection Time: 12/05/23 12:56 AM  Result Value Ref Range   Cholesterol 191 0 - 200 mg/dL   Triglycerides 81 <952 mg/dL   HDL 42 >84 mg/dL   Total CHOL/HDL Ratio 4.5 RATIO   VLDL 16 0 - 40 mg/dL   LDL Cholesterol 132 (H) 0 - 99 mg/dL    Comment:        Total  Cholesterol/HDL:CHD  Risk Coronary Heart Disease Risk Table                     Men   Women  1/2 Average Risk   3.4   3.3  Average Risk       5.0   4.4  2 X Average Risk   9.6   7.1  3 X Average Risk  23.4   11.0        Use the calculated Patient Ratio above and the CHD Risk Table to determine the patient's CHD Risk.        ATP III CLASSIFICATION (LDL):  <100     mg/dL   Optimal  161-096  mg/dL   Near or Above                    Optimal  130-159  mg/dL   Borderline  045-409  mg/dL   High  >811     mg/dL   Very High Performed at [facility], [address], [place], [place] [address]   Troponin I (High Sensitivity)     Status: Abnormal   Collection Time: 12/05/23 12:56 AM  Result Value Ref Range   Troponin I (High Sensitivity) 38 (H) <18 ng/L    Comment: (NOTE) Elevated high sensitivity troponin I (hsTnI) values and significant  changes across serial measurements may suggest ACS but many other  chronic and acute conditions are known to elevate hsTnI results.  Refer to the "Links" section for chest pain algorithms and additional  guidance. Performed at [facility], [address], [place], [place] [address]   Heparin level (unfractionated)     Status: Abnormal   Collection Time: 12/05/23  3:44 AM  Result Value Ref Range   Heparin Unfractionated <0.10 (L) 0.30 - 0.70 IU/mL    Comment: (NOTE) The clinical reportable range upper limit is being lowered to >1.10 to align with the FDA approved guidance for the current laboratory assay.  If heparin results are below expected values, and patient dosage has  been confirmed, suggest follow up testing of antithrombin III levels. Performed at [facility], [address], [place], [place] [address]   CBC     Status: Abnormal   Collection Time: 12/05/23  1:29 PM  Result Value Ref Range   WBC 7.8 4.0 - 10.5 K/uL   RBC 5.40 4.22 - 5.81 MIL/uL   Hemoglobin 12.8 (L) 13.0 - 17.0 g/dL   HCT 57.8 46.9 - 62.9 %   MCV 75.4 (L) 80.0 -  100.0 fL   MCH 23.7 (L) 26.0 - 34.0 pg   MCHC 31.4 30.0 - 36.0 g/dL   RDW 52.8 (H) 41.3 - 24.4 %   Platelets 180 150 - 400 K/uL   nRBC 0.0 0.0 - 0.2 %    Comment: Performed at [facility], [address], [place], [place] [address]  Hemoglobin A1c     Status: None   Collection Time: 12/05/23  1:29 PM  Result Value Ref Range   Hgb A1c MFr Bld 5.6 4.8 - 5.6 %    Comment: (NOTE) Pre diabetes:          5.7%-6.4%  Diabetes:              >6.4%  Glycemic control for   <7.0% adults with diabetes    Mean Plasma Glucose 114.02 mg/dL    Comment: Performed at [facility]  Lab, [address] [place], [place] [address]   ECHOCARDIOGRAM COMPLETE  Result Date: 12/05/2023    ECHOCARDIOGRAM REPORT   Patient Name:   [name] [facility] Date of Exam: 12/05/2023 Medical Rec #:  [id number]        Height:       72.0 in Accession #:    [id number]       Weight:       289.9 lb Date of Birth:  1961-03-16         BSA:          2.495 m Patient Age:    62 years         BP:           121/63 mmHg Patient Gender: M                HR:           58 bpm. Exam Location:  Inpatient Procedure: 2D Echo, Color Doppler, Cardiac Doppler and Intracardiac            Opacification Agent Indications:    Chest Pain  History:        Patient has no prior history of Echocardiogram examinations.                 Signs/Symptoms:Chest Pain; Risk Factors:Hypertension.  Sonographer:    Milbert Coulter Referring Phys: [id number] KELLY A GRIFFITH  Sonographer Comments: Suboptimal apical window, patient is obese and no subcostal window. Image acquisition challenging due to patient body habitus. IMPRESSIONS  1. Left ventricular ejection fraction, by estimation, is 55 to 60%. The left ventricle has normal function. The left ventricle has no regional wall motion abnormalities. There is severe concentric left ventricular hypertrophy. Left ventricular diastolic  parameters are consistent with Grade I diastolic dysfunction (impaired relaxation).  2. Right  ventricular systolic function was not well visualized. The right ventricular size is not well visualized.  3. A small pericardial effusion is present.  4. No evidence of mitral valve regurgitation.  5. Aortic valve regurgitation is not visualized.  6. IVC not visualized. FINDINGS  Left Ventricle: Left ventricular ejection fraction, by estimation, is 55 to 60%. The left ventricle has normal function. The left ventricle has no regional wall motion abnormalities. Definity contrast agent was given IV to delineate the left ventricular  endocardial borders. The left ventricular internal cavity size was normal in size. There is severe concentric left ventricular hypertrophy. Left ventricular diastolic parameters are consistent with Grade I diastolic dysfunction (impaired relaxation). Right Ventricle: The right ventricular size is not well visualized. Right ventricular systolic function was not well visualized. Left Atrium: Left atrial size was not well visualized. Right Atrium: Right atrial size was not well visualized. Pericardium: A small pericardial effusion is present. Mitral Valve: No evidence of mitral valve regurgitation. Tricuspid Valve: Tricuspid valve regurgitation is not demonstrated. Aortic Valve: Aortic valve regurgitation is not visualized. Aortic valve mean gradient measures 3.0 mmHg. Aortic valve peak gradient measures 5.9 mmHg. Aortic valve area, by VTI measures 3.67 cm. Pulmonic Valve: Pulmonic valve regurgitation is mild. Aorta: The aortic root and ascending aorta are structurally normal, with no evidence of dilitation. Venous: IVC not visualized. IAS/Shunts: The interatrial septum was not well visualized.  LEFT VENTRICLE PLAX 2D LVIDd:         5.00 cm   Diastology LVIDs:         3.60 cm   LV e' medial:    9.36 cm/s LV PW:  1.60 cm   LV E/e' medial:  6.0 LV IVS:        1.60 cm   LV e' lateral:   7.62 cm/s LVOT diam:     2.40 cm   LV E/e' lateral: 7.4 LV SV:         84 LV SV Index:   34 LVOT Area:      4.52 cm  LEFT ATRIUM         Index LA diam:    4.20 cm 1.68 cm/m  AORTIC VALVE AV Area (Vmax):    3.54 cm AV Area (Vmean):   3.42 cm AV Area (VTI):     3.67 cm AV Vmax:           121.00 cm/s AV Vmean:          82.700 cm/s AV VTI:            0.229 m AV Peak Grad:      5.9 mmHg AV Mean Grad:      3.0 mmHg LVOT Vmax:         94.70 cm/s LVOT Vmean:        62.500 cm/s LVOT VTI:          0.186 m LVOT/AV VTI ratio: 0.81  AORTA Ao Root diam: 4.10 cm Ao Asc diam:  3.50 cm MITRAL VALVE MV Area (PHT): 3.16 cm    SHUNTS MV Decel Time: 240 msec    Systemic VTI:  0.19 m MV E velocity: 56.50 cm/s  Systemic Diam: 2.40 cm MV A velocity: 67.10 cm/s MV E/A ratio:  0.84 [place] signed by Carolan Clines Signature Date/Time: 12/05/2023/11:18:29 AM    Final    DG Chest 2 View  Result Date: 12/04/2023 CLINICAL DATA:  Chest pain yesterday EXAM: CHEST - 2 VIEW COMPARISON:  09/24/2017 FINDINGS: The heart size and mediastinal contours are within normal limits. Both lungs are clear. The visualized skeletal structures are unremarkable. IMPRESSION: No active cardiopulmonary disease. Electronically Signed   By: Sharlet Salina M.D.   On: 12/04/2023 17:35    Pending Labs Unresulted Labs (From admission, onward)     Start     Ordered   12/06/23 0500  CBC  Daily,   R      12/05/23 0537   12/06/23 0500  Basic metabolic panel  Daily,   R      12/05/23 0919   12/06/23 0500  Magnesium  Daily,   R      12/05/23 0919            Vitals/Pain Today's Vitals   12/05/23 1200 12/05/23 1239 12/05/23 1300 12/05/23 1453  BP: (!) 144/72  (!) 112/58   Pulse: 66  (!) 35   Resp: (!) 21  17   Temp:  98.2 F (36.8 C)    TempSrc:  Oral    SpO2: 96%  98%   Weight:      Height:      PainSc:    0-No pain    Isolation Precautions No active isolations  Medications Medications  acetaminophen (TYLENOL) tablet 650 mg (has no administration in time range)  ondansetron (ZOFRAN) injection 4 mg (has no administration  in time range)  aspirin EC tablet 81 mg (81 mg Oral Given 12/05/23 1003)  amLODipine (NORVASC) tablet 5 mg (5 mg Oral Given 12/05/23 1004)  hydrALAZINE (APRESOLINE) injection 10 mg (has no administration in time range)  ipratropium-albuterol (DUONEB) 0.5-2.5 (3) MG/3ML nebulizer solution 3 mL (  has no administration in time range)  guaiFENesin (ROBITUSSIN) 100 MG/5ML liquid 5 mL (has no administration in time range)  senna-docusate (Senokot-S) tablet 1 tablet (has no administration in time range)  traZODone (DESYREL) tablet 50 mg (has no administration in time range)  atorvastatin (LIPITOR) tablet 40 mg (40 mg Oral Given 12/05/23 1003)  perflutren lipid microspheres (DEFINITY) IV suspension (3 mLs Intravenous Given 12/05/23 1051)  enoxaparin (LOVENOX) injection 40 mg (40 mg Subcutaneous Given 12/05/23 1328)  heparin bolus via infusion 4,000 Units (4,000 Units Intravenous Bolus from Bag 12/04/23 2051)    Mobility walks     Focused Assessments     R Recommendations: See Admitting Provider Note  Report given to:   Additional Notes: Please call or message for any additional question [phone]

## 2023-12-05 NOTE — Progress Notes (Signed)
ANTICOAGULATION CONSULT NOTE - Initial Consult  Pharmacy Consult for Heparin Indication: chest pain/ACS  No Known Allergies  Patient Measurements: Height: 6' (182.9 cm) Weight: 131.5 kg (289 lb 14.5 oz) IBW/kg (Calculated) : 77.6 Heparin Dosing Weight: 107.4 kg  Vital Signs: Temp: 97.8 F (36.6 C) (12/08 0418) Temp Source: Oral (12/08 0418) BP: 125/106 (12/08 0500) Pulse Rate: 53 (12/08 0500)  Labs: Recent Labs    12/04/23 1701 12/04/23 1850 12/05/23 0056 12/05/23 0344  HGB 15.7  --   --   --   HCT 50.3  --   --   --   PLT 192  --   --   --   HEPARINUNFRC  --   --   --  <0.10*  CREATININE 1.17  --   --   --   TROPONINIHS 33* 44* 38*  --     Estimated Creatinine Clearance: 94.2 mL/min (by C-G formula based on SCr of 1.17 mg/dL).  Assessment: 62 yom with a history of htn. Patient is presenting with chest pain. Heparin per pharmacy consult placed for chest pain/ACS.  Patient is not on anticoagulation prior to arrival. Hgb 15.7; plt 192  12/8 AM: heparin level returned at <0.1 (undetectable). Per RN, no issues with the heparin infusion running continuously or signs/symptoms of bleeding   Goal of Therapy:  Heparin level 0.3-0.7 units/ml Monitor platelets by anticoagulation protocol: Yes   Plan:  Increase heparin infusion to 1700 units/hr Check anti-Xa level in 6 hours and daily while on heparin Continue to monitor H&H and platelets  Arabella Merles, PharmD. Clinical Pharmacist 12/05/2023 5:31 AM

## 2023-12-05 NOTE — Progress Notes (Signed)
Patient arrived to room 3E08 in NAD, VS stable and patient free from pain. Patient oriented to room and call bell in reach.

## 2023-12-06 ENCOUNTER — Telehealth (INDEPENDENT_AMBULATORY_CARE_PROVIDER_SITE_OTHER): Payer: Self-pay | Admitting: Primary Care

## 2023-12-06 ENCOUNTER — Other Ambulatory Visit (HOSPITAL_COMMUNITY): Payer: Self-pay

## 2023-12-06 ENCOUNTER — Telehealth: Payer: Self-pay | Admitting: Primary Care

## 2023-12-06 LAB — CBC
HCT: 42.9 % (ref 39.0–52.0)
Hemoglobin: 13.5 g/dL (ref 13.0–17.0)
MCH: 23.4 pg — ABNORMAL LOW (ref 26.0–34.0)
MCHC: 31.5 g/dL (ref 30.0–36.0)
MCV: 74.2 fL — ABNORMAL LOW (ref 80.0–100.0)
Platelets: 181 10*3/uL (ref 150–400)
RBC: 5.78 MIL/uL (ref 4.22–5.81)
RDW: 16.7 % — ABNORMAL HIGH (ref 11.5–15.5)
WBC: 7 10*3/uL (ref 4.0–10.5)
nRBC: 0 % (ref 0.0–0.2)

## 2023-12-06 LAB — MAGNESIUM: Magnesium: 2.2 mg/dL (ref 1.7–2.4)

## 2023-12-06 LAB — BASIC METABOLIC PANEL
Anion gap: 9 (ref 5–15)
BUN: 20 mg/dL (ref 6–20)
CO2: 24 mmol/L (ref 22–32)
Calcium: 8.7 mg/dL — ABNORMAL LOW (ref 8.9–10.3)
Chloride: 103 mmol/L (ref 98–111)
Creatinine, Ser: 1.29 mg/dL — ABNORMAL HIGH (ref 0.61–1.24)
GFR, Estimated: >60 mL/min (ref >60)
Glucose, Bld: 106 mg/dL — ABNORMAL HIGH (ref 70–99)
Potassium: 3.5 mmol/L (ref 3.5–5.1)
Sodium: 136 mmol/L (ref 135–145)

## 2023-12-06 MED ORDER — ASPIRIN 81 MG PO TBEC
81.0000 mg | DELAYED_RELEASE_TABLET | Freq: Every day | ORAL | 0 refills | Status: AC
  Filled 2023-12-06 – 2024-03-07 (×2): qty 30, 30d supply, fill #0

## 2023-12-06 MED ORDER — AMLODIPINE BESYLATE 10 MG PO TABS
10.0000 mg | ORAL_TABLET | Freq: Every day | ORAL | Status: DC
Start: 2023-12-06 — End: 2023-12-06
  Administered 2023-12-06: 10 mg via ORAL
  Filled 2023-12-06: qty 1

## 2023-12-06 MED ORDER — ATORVASTATIN CALCIUM 40 MG PO TABS
40.0000 mg | ORAL_TABLET | Freq: Every day | ORAL | 0 refills | Status: AC
  Filled 2023-12-06 – 2024-03-07 (×2): qty 30, 30d supply, fill #0

## 2023-12-06 MED ORDER — AMLODIPINE BESYLATE 10 MG PO TABS
10.0000 mg | ORAL_TABLET | Freq: Every day | ORAL | 0 refills | Status: DC
  Filled 2023-12-06 – 2024-03-07 (×2): qty 30, 30d supply, fill #0

## 2023-12-06 NOTE — Progress Notes (Signed)
  MATCH MEDICATION ASSISTANCE CARD Pharmacies please call [phone] for claim processing assistance.  Rx BIN: [id number] Rx Group: [id number] Rx PCN: [id number] Relationship Code: 1 Person Code: 01  Patient ID (MRN): [id number]    Patient Name:[name]   Patient DOB: 07/15/1961   Discharge Date:12/06/23  Expiration Date: 12/14/23 (must be filled within 7 days of discharge)

## 2023-12-06 NOTE — Telephone Encounter (Signed)
Copied from CRM [id number]. Topic: Appointments - Appointment Info/Confirmation >> Dec 06, 2023 10:56 AM Shon Hale wrote: Reason for CRM: Pt scheduled for Hospital F/U on 12/21/2023. Pt is self pay.

## 2023-12-06 NOTE — Telephone Encounter (Signed)
Called and spoke to someone about seeing if pt wanted to schedule a F/U apt.  Pt's mother stated that pt was still in the hospital and that and once discharged pt would be able to give a call back and decide if they want to do a F/U apt.

## 2023-12-06 NOTE — TOC Transition Note (Addendum)
Transition of Care [facility]) - CM/SW Discharge Note   Patient Details  Name: [name] MRN: [id number] Date of Birth: 08/02/1961  [facility] CM/SW Contact:  Leone Haven, RN Phone Number: 12/06/2023, 11:09 AM   Clinical Narrative:    For dc today, NCM assisted with Match Card for medications.  Girlfriend will transport home today. NCM left vm for Livingston Diones , finacial counselor to contact patient regarding medicaid. [place] with Financial counseling states Meriam Sprague is not here today, so either He will call patient or [place] will call patient to discuss Medicaid. Patient cell phone is [phone].   Final next level of care: Home/Self Care Barriers to Discharge: No Barriers Identified   Patient Goals and CMS Choice   Choice offered to / list presented to : NA  Discharge Placement                         Discharge Plan and Services Additional resources added to the After Visit Summary for   In-house Referral: NA Discharge Planning Services: CM Consult Post Acute Care Choice: NA          DME Arranged: N/A DME Agency: NA       HH Arranged: NA          Social Determinants of Health (SDOH) Interventions SDOH Screenings   Food Insecurity: No Food Insecurity (12/05/2023)  Housing: Low Risk  (12/05/2023)  Transportation Needs: No Transportation Needs (12/05/2023)  Utilities: Not At Risk (12/05/2023)  Depression (PHQ2-9): Low Risk  (08/20/2021)  Tobacco Use: Low Risk  (12/05/2023)     Readmission Risk Interventions     No data to display

## 2023-12-06 NOTE — Telephone Encounter (Signed)
Noted, thank you

## 2023-12-06 NOTE — TOC Initial Note (Signed)
Transition of Care [facility]) - Initial/Assessment Note    Patient Details  Name: [name] MRN: [id number] Date of Birth: June 29, 1961  [facility]) CM/SW Contact:    Leone Haven, RN Phone Number: 12/06/2023, 11:04 AM  Clinical Narrative:                 From home with girlfriend,  has  no PCP and insurance on file, states has no HH services in place at this time or DME at home.  States girl friend will transport him home at [employer] and she is support system, states gets medications from [facility] [place].  Pta self ambulatory .  Expected Discharge Plan: Home/Self Care Barriers to Discharge: No Barriers Identified   Patient Goals and CMS Choice Patient states their goals for this hospitalization and ongoing recovery are:: return home with friend   Choice offered to / list presented to : NA      Expected Discharge Plan and Services In-house Referral: NA Discharge Planning Services: CM Consult Post Acute Care Choice: NA Living arrangements for the past 2 months: Single Family Home Expected Discharge Date: 12/06/23               DME Arranged: N/A DME Agency: NA       HH Arranged: NA          Prior Living Arrangements/Services Living arrangements for the past 2 months: Single Family Home Lives with:: Friends Patient language and need for interpreter reviewed:: Yes Do you feel safe going back to the place where you live?: Yes      Need for Family Participation in Patient Care: Yes (Comment) Care giver support system in place?: Yes (comment)   Criminal Activity/Legal Involvement Pertinent to Current Situation/Hospitalization: No - Comment as needed  Activities of Daily Living   ADL Screening (condition at time of admission) Independently performs ADLs?: Yes (appropriate for developmental age) Is the patient deaf or have difficulty hearing?: No Does the patient have difficulty seeing, even when wearing glasses/contacts?: No Does the patient have  difficulty concentrating, remembering, or making decisions?: No  Permission Sought/Granted Permission sought to share information with : Case Manager Permission granted to share information with : Yes, Verbal Permission Granted              Emotional Assessment Appearance:: Appears stated age Attitude/Demeanor/Rapport: Engaged Affect (typically observed): Appropriate Orientation: : Oriented to Self, Oriented to Place, Oriented to  Time, Oriented to Situation      Admission diagnosis:  Chest pain [R07.9] Chest pain, unspecified type [R07.9] Patient Active Problem List   Diagnosis Date Noted   Chest pain 12/04/2023   Hypertensive emergency 12/04/2023   Acute pulmonary edema (HCC) 05/04/2018   PCP:  Grayce Sessions, NP Pharmacy:   [facility] [address] ([place]), [place] - [address] [place]) [place] [address] Phone: [phone] Fax: [phone]  [facility] [address] [place] [address] Phone: [phone] Fax: [phone]     Social Determinants of Health (SDOH) Social History: SDOH Screenings   Food Insecurity: No Food Insecurity (12/05/2023)  Housing: Low Risk  (12/05/2023)  Transportation Needs: No Transportation Needs (12/05/2023)  Utilities: Not At Risk (12/05/2023)  Depression (PHQ2-9): Low Risk  (08/20/2021)  Tobacco Use: Low Risk  (12/05/2023)   SDOH Interventions:     Readmission Risk Interventions     No data to display

## 2023-12-06 NOTE — Telephone Encounter (Signed)
Pt call back, rescheduled him for HFU on 12/23/23 @ 10:30 am since office will be closed at 12 pm on 12/21/2023.

## 2023-12-06 NOTE — Progress Notes (Addendum)
AVS medications filled in, printed and RN Ardith Dark reviewed pt's instructions, pt going to the discharge lounge.   Chyanna Flock,RN SWOT   Pt now asking for a work note, message sent to MD. Await note before taking pt to discharge lounge.

## 2023-12-06 NOTE — Discharge Summary (Signed)
Physician Discharge Summary  [name]:[id number] DOB: 15-Dec-1961 DOA: 12/04/2023  PCP: No primary care provider on file.  Admit date: 12/04/2023 Discharge date: 12/06/2023  Admitted From: Home Disposition: Home  Recommendations for Outpatient Follow-up:  Follow up with PCP in 1-2 weeks Please obtain BMP/CBC in one week your next doctors visit.  Norvasc, aspirin, statin prescribed   Discharge Condition: Stable CODE STATUS: Full code Diet recommendation: Low-salt  Brief/Interim Summary: Brief Narrative:   62 year old with history of HTN not on medications due to cost comes to the ED with complaints of chest pain.  Upon admission noted to have significantly elevated blood pressure with systolic in 190s, EKG did not show any acute ST-T changes.  Admitted for further management.  Echocardiogram showed preserved EF with severe LVH.  He was started on Norvasc which helped his blood pressure.  He was also started on aspirin and statin due to elevated LDL.  Recommended that he follows up with outpatient cardiology.  At this time is advised to avoid beta-blockers due to his cocaine use. Medically stable for discharge.  Assessment & Plan:  Principal Problem:   Chest pain Active Problems:   Hypertensive emergency    Chest pain, resolved Hypertensive emergency; improved Cocaine Positive EKG does not show any acute ST-T changes.  Initial blood pressure into 190s.  Echocardiogram shows EF 60% with severe LVH secondary to uncontrolled blood pressure.  Troponin 33 > 44 > 38.  LDL 133.  UDS positive for cocaine and THC.  Avoid any beta-blocker use.  Norvasc, aspirin, statin.  Stop heparin drip  DVT prophylaxis: Lovenox Code Status: Full code Family Communication:   Discharge today    Subjective: Feels well no complaints.  Wanting to go home  Examination:  General exam: Appears calm and comfortable  Respiratory system: Clear to auscultation. Respiratory effort  normal. Cardiovascular system: S1 & S2 heard, RRR. No JVD, murmurs, rubs, gallops or clicks. No pedal edema. Gastrointestinal system: Abdomen is nondistended, soft and nontender. No organomegaly or masses felt. Normal bowel sounds heard. Central nervous system: Alert and oriented. No focal neurological deficits. Extremities: Symmetric 5 x 5 power. Skin: No rashes, lesions or ulcers Psychiatry: Judgement and insight appear normal. Mood & affect appropriate.    Discharge Diagnoses:  Principal Problem:   Chest pain Active Problems:   Hypertensive emergency      Discharge Exam: Vitals:   12/06/23 0807 12/06/23 0818  BP: (!) 146/69 (!) 146/69  Pulse:    Resp: 18   Temp: 98.1 F (36.7 C)   SpO2:     Vitals:   12/06/23 0403 12/06/23 0534 12/06/23 0807 12/06/23 0818  BP:  (!) 149/90 (!) 146/69 (!) 146/69  Pulse:  (!) 45    Resp:  19 18   Temp:  97.9 F (36.6 C) 98.1 F (36.7 C)   TempSrc: Oral Oral Oral   SpO2:  98%    Weight:  (!) 154.4 kg    Height:         Discharge Instructions   Allergies as of 12/06/2023   No Known Allergies      Medication List     TAKE these medications    acetaminophen 500 MG tablet Commonly known as: TYLENOL Take 500 mg by mouth every 6 (six) hours as needed for moderate pain (pain score 4-6).   amLODipine 10 MG tablet Commonly known as: NORVASC Take 1 tablet (10 mg total) by mouth daily.   aspirin EC 81 MG tablet Take 1  tablet (81 mg total) by mouth daily. Swallow whole.   atorvastatin 40 MG tablet Commonly known as: LIPITOR Take 1 tablet (40 mg total) by mouth daily. Start taking on: December 07, 2023        Follow-up Information     Grayce Sessions, NP. Go on 12/21/2023.   Specialty: Internal Medicine Why: 2:30pm Contact information: [address] [place] [address] [phone]                No Known Allergies  You were cared for by a hospitalist during your hospital stay. If you have  any questions about your discharge medications or the care you received while you were in the hospital after you are discharged, you can call the unit and asked to speak with the hospitalist on call if the hospitalist that took care of you is not available. Once you are discharged, your primary care physician will handle any further medical issues. Please note that no refills for any discharge medications will be authorized once you are discharged, as it is imperative that you return to your primary care physician (or establish a relationship with a primary care physician if you do not have one) for your aftercare needs so that they can reassess your need for medications and monitor your lab values.  You were cared for by a hospitalist during your hospital stay. If you have any questions about your discharge medications or the care you received while you were in the hospital after you are discharged, you can call the unit and asked to speak with the hospitalist on call if the hospitalist that took care of you is not available. Once you are discharged, your primary care physician will handle any further medical issues. Please note that NO REFILLS for any discharge medications will be authorized once you are discharged, as it is imperative that you return to your primary care physician (or establish a relationship with a primary care physician if you do not have one) for your aftercare needs so that they can reassess your need for medications and monitor your lab values.  Please request your Prim.MD to go over all Hospital Tests and Procedure/Radiological results at the follow up, please get all Hospital records sent to your Prim MD by signing hospital release before you go home.  Get CBC, CMP, 2 view Chest X ray checked  by Primary MD during your next visit or SNF MD in 5-7 days ( we routinely change or add medications that can affect your baseline labs and fluid status, therefore we recommend that you get the  mentioned basic workup next visit with your PCP, your PCP may decide not to get them or add new tests based on their clinical decision)  On your next visit with your primary care physician please Get Medicines reviewed and adjusted.  If you experience worsening of your admission symptoms, develop shortness of breath, life threatening emergency, suicidal or homicidal thoughts you must seek medical attention immediately by calling 911 or calling your MD immediately  if symptoms less severe.  You Must read complete instructions/literature along with all the possible adverse reactions/side effects for all the Medicines you take and that have been prescribed to you. Take any new Medicines after you have completely understood and accpet all the possible adverse reactions/side effects.   Do not drive, operate heavy machinery, perform activities at heights, swimming or participation in water activities or provide baby sitting services if your were admitted for syncope or siezures  until you have seen by Primary MD or a Neurologist and advised to do so again.  Do not drive when taking Pain medications.   Procedures/Studies: ECHOCARDIOGRAM COMPLETE  Result Date: 12/05/2023    ECHOCARDIOGRAM REPORT   Patient Name:   [name] [facility] Date of Exam: 12/05/2023 Medical Rec #:  [id number]        Height:       72.0 in Accession #:    [id number]       Weight:       289.9 lb Date of Birth:  10-01-1961         BSA:          2.495 m Patient Age:    62 years         BP:           121/63 mmHg Patient Gender: M                HR:           58 bpm. Exam Location:  Inpatient Procedure: 2D Echo, Color Doppler, Cardiac Doppler and Intracardiac            Opacification Agent Indications:    Chest Pain  History:        Patient has no prior history of Echocardiogram examinations.                 Signs/Symptoms:Chest Pain; Risk Factors:Hypertension.  Sonographer:    Milbert Coulter Referring Phys: [id number] KELLY A GRIFFITH  Sonographer  Comments: Suboptimal apical window, patient is obese and no subcostal window. Image acquisition challenging due to patient body habitus. IMPRESSIONS  1. Left ventricular ejection fraction, by estimation, is 55 to 60%. The left ventricle has normal function. The left ventricle has no regional wall motion abnormalities. There is severe concentric left ventricular hypertrophy. Left ventricular diastolic  parameters are consistent with Grade I diastolic dysfunction (impaired relaxation).  2. Right ventricular systolic function was not well visualized. The right ventricular size is not well visualized.  3. A small pericardial effusion is present.  4. No evidence of mitral valve regurgitation.  5. Aortic valve regurgitation is not visualized.  6. IVC not visualized. FINDINGS  Left Ventricle: Left ventricular ejection fraction, by estimation, is 55 to 60%. The left ventricle has normal function. The left ventricle has no regional wall motion abnormalities. Definity contrast agent was given IV to delineate the left ventricular  endocardial borders. The left ventricular internal cavity size was normal in size. There is severe concentric left ventricular hypertrophy. Left ventricular diastolic parameters are consistent with Grade I diastolic dysfunction (impaired relaxation). Right Ventricle: The right ventricular size is not well visualized. Right ventricular systolic function was not well visualized. Left Atrium: Left atrial size was not well visualized. Right Atrium: Right atrial size was not well visualized. Pericardium: A small pericardial effusion is present. Mitral Valve: No evidence of mitral valve regurgitation. Tricuspid Valve: Tricuspid valve regurgitation is not demonstrated. Aortic Valve: Aortic valve regurgitation is not visualized. Aortic valve mean gradient measures 3.0 mmHg. Aortic valve peak gradient measures 5.9 mmHg. Aortic valve area, by VTI measures 3.67 cm. Pulmonic Valve: Pulmonic valve regurgitation  is mild. Aorta: The aortic root and ascending aorta are structurally normal, with no evidence of dilitation. Venous: IVC not visualized. IAS/Shunts: The interatrial septum was not well visualized.  LEFT VENTRICLE PLAX 2D LVIDd:         5.00 cm   Diastology LVIDs:  3.60 cm   LV e' medial:    9.36 cm/s LV PW:         1.60 cm   LV E/e' medial:  6.0 LV IVS:        1.60 cm   LV e' lateral:   7.62 cm/s LVOT diam:     2.40 cm   LV E/e' lateral: 7.4 LV SV:         84 LV SV Index:   34 LVOT Area:     4.52 cm  LEFT ATRIUM         Index LA diam:    4.20 cm 1.68 cm/m  AORTIC VALVE AV Area (Vmax):    3.54 cm AV Area (Vmean):   3.42 cm AV Area (VTI):     3.67 cm AV Vmax:           121.00 cm/s AV Vmean:          82.700 cm/s AV VTI:            0.229 m AV Peak Grad:      5.9 mmHg AV Mean Grad:      3.0 mmHg LVOT Vmax:         94.70 cm/s LVOT Vmean:        62.500 cm/s LVOT VTI:          0.186 m LVOT/AV VTI ratio: 0.81  AORTA Ao Root diam: 4.10 cm Ao Asc diam:  3.50 cm MITRAL VALVE MV Area (PHT): 3.16 cm    SHUNTS MV Decel Time: 240 msec    Systemic VTI:  0.19 m MV E velocity: 56.50 cm/s  Systemic Diam: 2.40 cm MV A velocity: 67.10 cm/s MV E/A ratio:  0.84 [place] signed by Carolan Clines Signature Date/Time: 12/05/2023/11:18:29 AM    Final    DG Chest 2 View  Result Date: 12/04/2023 CLINICAL DATA:  Chest pain yesterday EXAM: CHEST - 2 VIEW COMPARISON:  09/24/2017 FINDINGS: The heart size and mediastinal contours are within normal limits. Both lungs are clear. The visualized skeletal structures are unremarkable. IMPRESSION: No active cardiopulmonary disease. Electronically Signed   By: Sharlet Salina M.D.   On: 12/04/2023 17:35     The results of significant diagnostics from this hospitalization (including imaging, microbiology, ancillary and laboratory) are listed below for reference.     Microbiology: No results found for this or any previous visit (from the past 240 hour(s)).   Labs: BNP  (last 3 results) Recent Labs    12/04/23 1701  BNP 43.8   Basic Metabolic Panel: Recent Labs  Lab 12/04/23 1701 12/06/23 0250  NA 136 136  K 3.6 3.5  CL 101 103  CO2 22 24  GLUCOSE 99 106*  BUN 10 20  CREATININE 1.17 1.29*  CALCIUM 9.2 8.7*  MG  --  2.2   Liver Function Tests: No results for input(s): "AST", "ALT", "ALKPHOS", "BILITOT", "PROT", "ALBUMIN" in the last 168 hours. No results for input(s): "LIPASE", "AMYLASE" in the last 168 hours. No results for input(s): "AMMONIA" in the last 168 hours. CBC: Recent Labs  Lab 12/04/23 1701 12/05/23 1329 12/06/23 0250  WBC 12.7* 7.8 7.0  HGB 15.7 12.8* 13.5  HCT 50.3 40.7 42.9  MCV 75.2* 75.4* 74.2*  PLT 192 180 181   Cardiac Enzymes: No results for input(s): "CKTOTAL", "CKMB", "CKMBINDEX", "TROPONINI" in the last 168 hours. BNP: Invalid input(s): "POCBNP" CBG: No results for input(s): "GLUCAP" in the last 168 hours. D-Dimer No results  for input(s): "DDIMER" in the last 72 hours. Hgb A1c Recent Labs    12/05/23 1329  HGBA1C 5.6   Lipid Profile Recent Labs    12/05/23 0056  CHOL 191  HDL 42  LDLCALC 133*  TRIG 81  CHOLHDL 4.5   Thyroid function studies No results for input(s): "TSH", "T4TOTAL", "T3FREE", "THYROIDAB" in the last 72 hours.  Invalid input(s): "FREET3" Anemia work up No results for input(s): "VITAMINB12", "FOLATE", "FERRITIN", "TIBC", "IRON", "RETICCTPCT" in the last 72 hours. Urinalysis    Component Value Date/Time   COLORURINE COLORLESS (A) 09/24/2017 1752   APPEARANCEUR CLEAR 09/24/2017 1752   LABSPEC 1.004 (L) 09/24/2017 1752   PHURINE 6.0 09/24/2017 1752   GLUCOSEU NEGATIVE 09/24/2017 1752   HGBUR NEGATIVE 09/24/2017 1752   BILIRUBINUR NEGATIVE 09/24/2017 1752   KETONESUR NEGATIVE 09/24/2017 1752   PROTEINUR NEGATIVE 09/24/2017 1752   NITRITE NEGATIVE 09/24/2017 1752   LEUKOCYTESUR NEGATIVE 09/24/2017 1752   Sepsis Labs Recent Labs  Lab 12/04/23 1701 12/05/23 1329  12/06/23 0250  WBC 12.7* 7.8 7.0   Microbiology No results found for this or any previous visit (from the past 240 hour(s)).   Time coordinating discharge:  I have spent 35 minutes face to face with the patient and on the ward discussing the patients care, assessment, plan and disposition with other care givers. >50% of the time was devoted counseling the patient about the risks and benefits of treatment/Discharge disposition and coordinating care.   SIGNED:   Miguel Rota, MD  [facility] 12/06/2023, 12:03 PM   If 7PM-7AM, please contact night-coverage

## 2023-12-06 NOTE — Plan of Care (Signed)
  Problem: Education: Goal: Knowledge of General Education information will improve Description: Including pain rating scale, medication(s)/side effects and non-pharmacologic comfort measures Outcome: Progressing   Problem: Health Behavior/Discharge Planning: Goal: Ability to manage health-related needs will improve Outcome: Progressing   Problem: Nutrition: Goal: Adequate nutrition will be maintained Outcome: Progressing   Problem: Pain Management: Goal: General experience of comfort will improve Outcome: Progressing

## 2023-12-21 ENCOUNTER — Inpatient Hospital Stay (INDEPENDENT_AMBULATORY_CARE_PROVIDER_SITE_OTHER): Payer: Self-pay | Admitting: Primary Care

## 2023-12-23 ENCOUNTER — Ambulatory Visit (INDEPENDENT_AMBULATORY_CARE_PROVIDER_SITE_OTHER): Payer: Self-pay | Admitting: Primary Care

## 2023-12-23 ENCOUNTER — Encounter (INDEPENDENT_AMBULATORY_CARE_PROVIDER_SITE_OTHER): Payer: Self-pay | Admitting: Primary Care

## 2023-12-23 VITALS — BP 139/83 | HR 49 | Resp 16 | Ht 72.0 in | Wt 347.8 lb

## 2023-12-23 DIAGNOSIS — Z1159 Encounter for screening for other viral diseases: Secondary | ICD-10-CM

## 2023-12-23 DIAGNOSIS — Z9189 Other specified personal risk factors, not elsewhere classified: Secondary | ICD-10-CM

## 2023-12-23 DIAGNOSIS — Z09 Encounter for follow-up examination after completed treatment for conditions other than malignant neoplasm: Secondary | ICD-10-CM

## 2023-12-23 DIAGNOSIS — F1410 Cocaine abuse, uncomplicated: Secondary | ICD-10-CM

## 2023-12-23 DIAGNOSIS — I10 Essential (primary) hypertension: Secondary | ICD-10-CM

## 2023-12-23 DIAGNOSIS — Z1211 Encounter for screening for malignant neoplasm of colon: Secondary | ICD-10-CM

## 2023-12-23 NOTE — Patient Instructions (Signed)
Community Resources  Advocacy/Legal Legal Aid [place]:  1-866-219-5262  /  336-272-0148  Family Justice Center:  336-641-7233  [facility] 24-hr Crisis line:  336-273-7273  Women's Resource Center, [place]:  336-275-6090  Court Watch (custody):  336-275-2346  [place] [facility]:   336-279-9299    Baby & Breastfeeding Car Seat Inspection @ Various [place] Fire Depts.- call 336-373-2177  [place] [facility]  336-832-6860  [facility] 336-878-6712  WIC: 336-641-3663 ([place]);  336-641-7571 ([place])  La Leche League:  1-877-452-5321   Childcare [place] [facility]: 336-369-5097 ([place]) / 336-887-8224 ([place])  - Child Care Resources/ Referrals/ Scholarships  - Head Start/ Early Head Start (call or apply online)  [place] DHHS: [place] Pre-K :  1-800-859-0829 / 336-274-5437   Employment / Job Search [facility] [place]: 336-275-6090 / [address]  [place] [employer] (JobLink): 336-373-5922 ([place]) / 336-882-4141 ([place])  [facility] [employer] Community Resource/ Career Center: 336-275-9801 / 336-282-7307  [facility] Job & Career Center: 336-373-3764  DHHS Work First: 336-641-3447 ([place]) / 336-641-3447 ([place])  [facility] [place]:  336-676-5871   Financial Assistance [facility] [facility]:  336-553-2657  [employer]: 336-235-0368  [employer] (furniture):  336-370-4002  [place] [facility]: 336-373-4264  Low Income Energy Assistance  336-641-3000   Food Assistance DHHS- SNAP/ Food Stamps: 336-641-4588  WIC: [place]- 336-641-3663 ;  [place] 336-641-7571  Little Green Book- Free Meals  Little Blue Book- Free Food Pantries  During the summer, text "FOOD" to 877877   General Health / Clinics (Adults) Orange Card (for Adults) through [facility]: (336) 895-4900  [facility]:   336-832-8035  [facility]:   336-832-4444  Health Department:  336-641-3245  [facility]:  336-415-3877 / 336-641-2100  Planned [facility] of [place]:   336-373-0678  [facility]:   336-334-4822 x 50251   Housing [place] [facility]:   336-691-9521  [place] Housing Authority:  336-275-8501  Affordable Housing Managemnt:  336-273-0568   Immigrant/ Refugee Center for [place]ians (UNCG):  336-256-1065  [facility]:  336-379-0037  [employer] [facility]:  336-937-4701  [employer] World Services:  336-617-0381  African Services Coalition:  336-574-2677   LGBTQ YouthSAFE  www.youthsafe[place].org  PFLAG  336-541-6754 / info@[place].org  The Trevor Project:  1-866-488-7386   Mental Health/ Substance Use [facility]  336-387-6161  [facility]:  336-832-9700 or 1-800-711-2635  [facility] of Care:  336-271-5888  [facility]:  336-294-1349  [facility]:  336-542-2884  [facility] (walk-ins)  336-676-6840 / [address]  [place]:  800-449-1287  Alcoholics Anonymous:  336-854-4278  Narcotics Anonymous:  800-365-1036  Quit Smoking Hotline:  800-QUIT-NOW (800-784-8669)   Parenting Children's Home Society:  800-632-1400  [place]: Education Center & Support Groups:  336-832-6682  YWCA: 336-273-3461  [employer]CG: Bringing Out the Best:  336-334-3120               Thriving at Three (Hispanic families): 336-256-1066  Healthy Start ([facility]):  336-387-6161 x2288  Parents as Teachers:  336-691-0024  [place] [facility]- Learning Together (Immigrants): 336-369-5001   Poison Control 800-222-1222  Sports & Recreation [employer] Open Doors Application: ymcanwnc.org/join/open-doors-financial-assistance/  City of [place] Recreation Centers: http://www.[place].gov/index.aspx?page=3615   Special Needs Family Support Network:  336-832-6507  Autism Society of [place]:   336-333-0197 x1402 or x1412 /  800-785-1035  TEACCH :  336-334-5773     ARC of [place]:  336-373-1076  Children's Developmental Service Agency (CDSA):  336-334-5601  CC4C (Care Coordination for Children):  336-641-7641   Transportation Medicaid Transportation: 336-641-4848 to apply  [place] Transit Authority: 336-335-6499 (reduced-fare bus ID to Medicaid/ Medicare/ Orange Card)  SCAT Paratransit services: Eligible riders only, call 336-333-6589 for application   Tutoring/Mentoring Black Child Development Institute: 336-230-2138  Big Brothers/ Big Sisters: 336-378-9100 ([place])  336-882-4167 ([place])  ACES through child's school: 336-370-2321  [employer] Achievers: contact your local Y  SHIELD Mentor Program: 336-337-2771   

## 2023-12-23 NOTE — Progress Notes (Signed)
Subjective:   [name] is a 62 y.o. male presents for hospital follow up and last visit 08/20/21 Several ED visits- Lumbar strain, Left hip pain and uncontrolled hypertension  and cocaine use disorder. Admit date to the hospital was 12/04/23, patient was discharged from the hospital on 12/06/23, patient was admitted for: complaints of chest pain. Upon admission noted to have significantly elevated blood pressure with systolic in 190s, EKG did not show any acute ST-T changes. Admitted for further management. Echocardiogram showed preserved EF with severe LVH. Patient has No headache, No chest pain, No abdominal pain - No Nausea, No new weakness tingling or numbness, No Cough - shortness of breath.  Past Medical History:  Diagnosis Date   Hypertension    Obesity      No Known Allergies  Current Outpatient Medications on File Prior to Visit  Medication Sig Dispense Refill   acetaminophen (TYLENOL) 500 MG tablet Take 500 mg by mouth every 6 (six) hours as needed for moderate pain (pain score 4-6).     amLODipine (NORVASC) 10 MG tablet Take 1 tablet (10 mg total) by mouth daily. 90 tablet 0   aspirin EC 81 MG tablet Take 1 tablet (81 mg total) by mouth daily. Swallow whole. 90 tablet 0   atorvastatin (LIPITOR) 40 MG tablet Take 1 tablet (40 mg total) by mouth daily. 90 tablet 0   No current facility-administered medications on file prior to visit.    Review of System:  Comprehensive ROS Pertinent positive and negative noted in HPI   Objective:  BP 139/83   Pulse (!) 49   Resp 16   Ht 6' (1.829 m)   Wt (!) 347 lb 12.8 oz (157.8 kg)   SpO2 95%   BMI 47.17 kg/m     Physical Exam Vitals reviewed.  Constitutional:      Appearance: He is obese.  HENT:     Head: Normocephalic.     Right Ear: Tympanic membrane normal.     Left Ear: Tympanic membrane normal.     Nose: Nose normal.  Eyes:     Extraocular Movements: Extraocular movements intact.  Cardiovascular:     Rate  and Rhythm: Normal rate and regular rhythm.  Pulmonary:     Effort: Pulmonary effort is normal.     Breath sounds: Normal breath sounds.  Abdominal:     General: Bowel sounds are normal. There is distension.     Palpations: Abdomen is soft.  Musculoskeletal:        General: Normal range of motion.     Cervical back: Normal range of motion and neck supple.  Skin:    General: Skin is warm and dry.  Neurological:     Mental Status: He is alert and oriented to person, place, and time.  Psychiatric:        Mood and Affect: Mood normal.        Behavior: Behavior normal.      Assessment:  Diagnoses and all orders for this visit:  Hospital discharge follow-up -     Basic Metabolic Panel -     CBC with Differential/Platelet  Colon cancer screening -     Cologuard  Cocaine abuse (HCC) -     HCV Ab w Reflex to Quant PCR Information placed on AVS for interventions for drug abuse  Essential hypertension BP goal - < 130/80 Explained that having normal blood pressure is the goal and medications are helping to get to goal and  maintain normal blood pressure. DIET: Limit salt intake, read nutrition labels to check salt content, limit fried and high fatty foods  Avoid using multisymptom OTC cold preparations that generally contain sudafed which can rise BP. Consult with pharmacist on best cold relief products to use for persons with HTN EXERCISE Discussed incorporating exercise such as walking - 30 minutes most days of the week and can do in 10 minute intervals    -     Basic Metabolic Panel  Encounter for HCV screening test for high risk patient -     HCV Ab w Reflex to Quant PCR     This note has been created with Education officer, environmental. Any transcriptional errors are unintentional.   No follow-ups on file.  Grayce Sessions, NP 12/23/2023, 10:30 AM

## 2023-12-24 LAB — CBC WITH DIFFERENTIAL/PLATELET
Basophils Absolute: 0 10*3/uL (ref 0.0–0.2)
Basos: 1 %
EOS (ABSOLUTE): 0.2 10*3/uL (ref 0.0–0.4)
Eos: 3 %
Hematocrit: 47.4 % (ref 37.5–51.0)
Hemoglobin: 14 g/dL (ref 13.0–17.7)
Immature Grans (Abs): 0.1 10*3/uL (ref 0.0–0.1)
Immature Granulocytes: 1 %
Lymphocytes Absolute: 2.4 10*3/uL (ref 0.7–3.1)
Lymphs: 32 %
MCH: 22.9 pg — ABNORMAL LOW (ref 26.6–33.0)
MCHC: 29.5 g/dL — ABNORMAL LOW (ref 31.5–35.7)
MCV: 78 fL — ABNORMAL LOW (ref 79–97)
Monocytes Absolute: 0.5 10*3/uL (ref 0.1–0.9)
Monocytes: 7 %
Neutrophils Absolute: 4.4 10*3/uL (ref 1.4–7.0)
Neutrophils: 56 %
Platelets: 241 10*3/uL (ref 150–450)
RBC: 6.11 x10E6/uL — ABNORMAL HIGH (ref 4.14–5.80)
RDW: 17.2 % — ABNORMAL HIGH (ref 11.6–15.4)
WBC: 7.7 10*3/uL (ref 3.4–10.8)

## 2023-12-24 LAB — BASIC METABOLIC PANEL
BUN/Creatinine Ratio: 7 — ABNORMAL LOW (ref 10–24)
BUN: 8 mg/dL (ref 8–27)
CO2: 24 mmol/L (ref 20–29)
Calcium: 9.1 mg/dL (ref 8.6–10.2)
Chloride: 103 mmol/L (ref 96–106)
Creatinine, Ser: 1.1 mg/dL (ref 0.76–1.27)
Glucose: 90 mg/dL (ref 70–99)
Potassium: 4.2 mmol/L (ref 3.5–5.2)
Sodium: 140 mmol/L (ref 134–144)
eGFR: 77 mL/min/{1.73_m2} (ref >59)

## 2023-12-24 LAB — HCV AB W REFLEX TO QUANT PCR: HCV Ab: NONREACTIVE

## 2023-12-24 LAB — HCV INTERPRETATION

## 2024-01-01 ENCOUNTER — Encounter (HOSPITAL_COMMUNITY): Payer: Self-pay

## 2024-01-01 ENCOUNTER — Emergency Department (HOSPITAL_COMMUNITY)
Admission: EM | Admit: 2024-01-01 | Discharge: 2024-01-01 | Disposition: A | Discharge status: Home / Self Care | Payer: Self-pay | Attending: Emergency Medicine | Admitting: Emergency Medicine

## 2024-01-01 ENCOUNTER — Other Ambulatory Visit: Payer: Self-pay

## 2024-01-01 DIAGNOSIS — X501XXA Overexertion from prolonged static or awkward postures, initial encounter: Secondary | ICD-10-CM | POA: Insufficient documentation

## 2024-01-01 DIAGNOSIS — T148XXA Other injury of unspecified body region, initial encounter: Secondary | ICD-10-CM

## 2024-01-01 DIAGNOSIS — Z7982 Long term (current) use of aspirin: Secondary | ICD-10-CM | POA: Insufficient documentation

## 2024-01-01 DIAGNOSIS — S39012A Strain of muscle, fascia and tendon of lower back, initial encounter: Secondary | ICD-10-CM | POA: Insufficient documentation

## 2024-01-01 MED ORDER — METHOCARBAMOL 500 MG PO TABS: 500.0000 mg | ORAL_TABLET | Freq: Two times a day (BID) | ORAL | 0 refills | Status: AC

## 2024-01-01 MED ORDER — NAPROXEN 250 MG PO TABS
500.0000 mg | ORAL_TABLET | Freq: Once | ORAL | Status: AC
  Administered 2024-01-01: 500 mg via ORAL
  Filled 2024-01-01: qty 2

## 2024-01-01 MED ORDER — NAPROXEN 375 MG PO TABS: 375.0000 mg | ORAL_TABLET | Freq: Two times a day (BID) | ORAL | 0 refills | Status: AC

## 2024-01-01 MED ORDER — LIDOCAINE 5 % EX PTCH
1.0000 | MEDICATED_PATCH | CUTANEOUS | Status: DC
  Administered 2024-01-01: 1 via TRANSDERMAL
  Filled 2024-01-01: qty 1

## 2024-01-01 NOTE — ED Provider Notes (Signed)
 [place] EMERGENCY DEPARTMENT AT [facility] Provider Note   CSN: [id number] Arrival date & time: 01/01/24  9086     History  Chief Complaint  Patient presents with   Flank Pain    [name] is a 63 y.o. male with past medical history of BMI of 40 presents emergency department for evaluation of left lower back pain that started last evening.  He reports that he was celebrating with EtOH last evening when he accidentally stepped in a hole.  Ever since then he has had this lower back pain that is worsened with bending and twisting.  He denies history of kidney stone, urinary symptoms, fever    Flank Pain Pertinent negatives include no chest pain, no abdominal pain, no headaches and no shortness of breath.     Home Medications Prior to Admission medications   Medication Sig Start Date End Date Taking? Authorizing Provider  methocarbamol  (ROBAXIN ) 500 MG tablet Take 1 tablet (500 mg total) by mouth 2 (two) times daily for 5 days. 01/01/24 01/06/24 Yes Minnie Tinnie BRAVO, PA  naproxen  (NAPROSYN ) 375 MG tablet Take 1 tablet (375 mg total) by mouth 2 (two) times daily. 01/01/24  Yes Minnie Tinnie BRAVO, PA  acetaminophen  (TYLENOL ) 500 MG tablet Take 500 mg by mouth every 6 (six) hours as needed for moderate pain (pain score 4-6).    [provider]  amLODipine  (NORVASC ) 10 MG tablet Take 1 tablet (10 mg total) by mouth daily. 12/06/23   Amin, Ankit C, MD  aspirin  EC 81 MG tablet Take 1 tablet (81 mg total) by mouth daily. Swallow whole. 12/06/23   Amin, Ankit C, MD  atorvastatin  (LIPITOR) 40 MG tablet Take 1 tablet (40 mg total) by mouth daily. 12/07/23   Caleen Burgess BROCKS, MD      Allergies    Patient has no known allergies.    Review of Systems   Review of Systems  Constitutional:  Negative for chills, fatigue and fever.  Respiratory:  Negative for cough, chest tightness, shortness of breath and wheezing.   Cardiovascular:  Negative for chest pain and palpitations.   Gastrointestinal:  Negative for abdominal pain, constipation, diarrhea, nausea and vomiting.  Genitourinary:  Positive for flank pain.  Neurological:  Negative for dizziness, seizures, weakness, light-headedness, numbness and headaches.    Physical Exam Updated Vital Signs BP (!) 155/78   Pulse 75   Temp 97.8 F (36.6 C) (Oral)   Resp 18   Ht 6' (1.829 m)   Wt 136.1 kg   SpO2 99%   BMI 40.69 kg/m  Physical Exam Vitals and nursing note reviewed.  Constitutional:      General: He is not in acute distress.    Appearance: Normal appearance.  HENT:     Head: Normocephalic and atraumatic.  Eyes:     Conjunctiva/sclera: Conjunctivae normal.  Cardiovascular:     Rate and Rhythm: Normal rate.  Pulmonary:     Effort: Pulmonary effort is normal. No respiratory distress.  Musculoskeletal:       Arms:     Cervical back: Normal range of motion and neck supple. No rigidity, tenderness or bony tenderness.     Thoracic back: No tenderness or bony tenderness. Normal range of motion.     Lumbar back: Tenderness present. No bony tenderness. Normal range of motion.     Right lower leg: No edema.     Left lower leg: No edema.  Skin:    General: Skin is  warm.     Capillary Refill: Capillary refill takes less than 2 seconds.     Coloration: Skin is not jaundiced or pale.  Neurological:     Mental Status: He is alert and oriented to person, place, and time. Mental status is at baseline.     Comments: Follows commands appropriately.  Ambulates without difficulty    ED Results / Procedures / Treatments   Labs (all labs ordered are listed, but only abnormal results are displayed) Labs Reviewed - No data to display  EKG None  Radiology No results found.  Procedures Procedures    Medications Ordered in ED Medications  naproxen  (NAPROSYN ) tablet 500 mg (500 mg Oral Given 01/01/24 1119)    ED Course/ Medical Decision Making/ A&P                                 Medical Decision  Making Risk Prescription drug management.   Patient presents to the ED for concern of left lower back pain, this involves an extensive number of treatment options, and is a complaint that carries with it a high risk of complications and morbidity.  The differential diagnosis includes fracture, muscle strain, stone, UTI.   Co morbidities that complicate the patient evaluation  BMI 40   Additional history obtained:  Additional history obtained from Nursing and Outside Medical Records   External records from outside source obtained and reviewed including  Triage note Recent medical evaluations and medication list    Medicines ordered and prescription drug management:  I ordered medication including Robaxin  and naproxen  for muscle strain I have reviewed the patients home medicines and have made adjustments as needed     Problem List / ED Course:  Muscle strain Physical exam remarkable for tenderness of paraspinous musculature on left side that is worsened with bending and twisting.  No overlying skin changes. Will provide naproxen  and Robaxin  for pain and muscle relaxer Provided work note for weekend.  As well as light duty for next week to heal muscle strain. Discussed findings, return to emergency department precautions with patient expresses understanding agrees with plan.  He is agreeable to discharge at this time.  All questions answered to his satisfaction.  All discharge instructions provided on discharge paperwork.   Reevaluation:  After the interventions noted above, I reevaluated the patient and found that they have :stayed the same   Social Determinants of Health:  Has PCP follow-up -encouraged patient to follow-up with PCP if symptoms do not improve within next 1 to 2 weeks   Dispostion:  After consideration of the diagnostic results and the patients response to treatment, I feel that the patent would benefit from outpatient management.    Final Clinical  Impression(s) / ED Diagnoses Final diagnoses:  Muscle strain    Rx / DC Orders ED Discharge Orders          Ordered    naproxen  (NAPROSYN ) 375 MG tablet  2 times daily        01/01/24 1106    methocarbamol  (ROBAXIN ) 500 MG tablet  2 times daily        01/01/24 1106              Minnie Tinnie BRAVO, [place] 01/02/24 9366    Towana Ozell BROCKS, MD 01/02/24 [phone]

## 2024-01-01 NOTE — ED Triage Notes (Signed)
 Pt arrived POV from home c/o left flank pain that started last night. Pt denies any urinary symptoms.

## 2024-01-01 NOTE — Discharge Instructions (Signed)
 Thank you for letting us  evaluate you today.  We provided you with pain medicine and muscle relaxer prescription.  Please pick this up. Please follow-up with your primary care provider if symptoms do not improve within the next 1 to 2 weeks. Return to emergency department if you experience loss of sensation in genitals, urinary incontinence, or significant worsening of pain

## 2024-03-07 ENCOUNTER — Other Ambulatory Visit (HOSPITAL_COMMUNITY): Payer: Self-pay

## 2024-03-20 ENCOUNTER — Other Ambulatory Visit (HOSPITAL_COMMUNITY): Payer: Self-pay

## 2024-03-23 ENCOUNTER — Telehealth (INDEPENDENT_AMBULATORY_CARE_PROVIDER_SITE_OTHER): Payer: Self-pay | Admitting: Primary Care

## 2024-03-23 NOTE — Telephone Encounter (Signed)
 Called pt but pt was unavailable. Could not leave VM

## 2024-03-24 ENCOUNTER — Ambulatory Visit (INDEPENDENT_AMBULATORY_CARE_PROVIDER_SITE_OTHER): Payer: Self-pay | Admitting: Primary Care

## 2024-04-06 ENCOUNTER — Emergency Department (HOSPITAL_COMMUNITY): Payer: Self-pay

## 2024-04-06 ENCOUNTER — Other Ambulatory Visit: Payer: Self-pay

## 2024-04-06 ENCOUNTER — Emergency Department (HOSPITAL_COMMUNITY)
Admission: EM | Admit: 2024-04-06 | Discharge: 2024-04-06 | Disposition: A | Discharge status: Home / Self Care | Payer: Self-pay | Attending: Emergency Medicine | Admitting: Emergency Medicine

## 2024-04-06 DIAGNOSIS — I10 Essential (primary) hypertension: Secondary | ICD-10-CM | POA: Insufficient documentation

## 2024-04-06 DIAGNOSIS — Z7982 Long term (current) use of aspirin: Secondary | ICD-10-CM | POA: Insufficient documentation

## 2024-04-06 DIAGNOSIS — Z79899 Other long term (current) drug therapy: Secondary | ICD-10-CM | POA: Insufficient documentation

## 2024-04-06 DIAGNOSIS — Y9301 Activity, walking, marching and hiking: Secondary | ICD-10-CM | POA: Insufficient documentation

## 2024-04-06 DIAGNOSIS — W1839XA Other fall on same level, initial encounter: Secondary | ICD-10-CM | POA: Insufficient documentation

## 2024-04-06 DIAGNOSIS — Y92007 Garden or yard of unspecified non-institutional (private) residence as the place of occurrence of the external cause: Secondary | ICD-10-CM | POA: Insufficient documentation

## 2024-04-06 DIAGNOSIS — M545 Low back pain, unspecified: Secondary | ICD-10-CM | POA: Insufficient documentation

## 2024-04-06 MED ORDER — ACETAMINOPHEN 500 MG PO TABS
1000.0000 mg | ORAL_TABLET | Freq: Once | ORAL | Status: AC
  Administered 2024-04-06: 1000 mg via ORAL
  Filled 2024-04-06: qty 2

## 2024-04-06 MED ORDER — IBUPROFEN 400 MG PO TABS
400.0000 mg | ORAL_TABLET | Freq: Once | ORAL | Status: AC
  Administered 2024-04-06: 400 mg via ORAL
  Filled 2024-04-06: qty 1

## 2024-04-06 NOTE — ED Triage Notes (Signed)
 Pt. Stated, I fell last night from a hole in the ground, my lower back is hurting

## 2024-04-06 NOTE — ED Provider Notes (Signed)
 [place] EMERGENCY DEPARTMENT AT [facility] Provider Note   CSN: [id number] Arrival date & time: 04/06/24  7829     History  Chief Complaint  Patient presents with   Back Pain    [name] is a 63 y.o. male.  Patient is a 64 year old male with a history of obesity and hypertension who is presenting today with complaints of back pain.  He reports he was walking in the yard last night and he stepped in a hole causing him to twist his back and since that time he has had a pain in the center of his lower back.  He did take some Goody powder last night and said that it helped initially with the pain but it was hurting again this morning.  It is only in the center of the back and does not radiate down his legs.  It does not in fact the way he is able to walk.  The history is provided by the patient.  Back Pain      Home Medications Prior to Admission medications   Medication Sig Start Date End Date Taking? Authorizing Provider  acetaminophen (TYLENOL) 500 MG tablet Take 500 mg by mouth every 6 (six) hours as needed for moderate pain (pain score 4-6).    [provider]  amLODipine (NORVASC) 10 MG tablet Take 1 tablet (10 mg total) by mouth daily. 12/06/23   Miguel Rota, MD  aspirin EC 81 MG tablet Take 1 tablet (81 mg total) by mouth daily. Swallow whole. 12/06/23   Amin, Ankit C, MD  atorvastatin (LIPITOR) 40 MG tablet Take 1 tablet (40 mg total) by mouth daily. 12/07/23   Amin, Ankit C, MD  naproxen (NAPROSYN) 375 MG tablet Take 1 tablet (375 mg total) by mouth 2 (two) times daily. 01/01/24   Judithann Sheen, PA      Allergies    Patient has no known allergies.    Review of Systems   Review of Systems  Musculoskeletal:  Positive for back pain.    Physical Exam Updated Vital Signs BP (!) 157/76 (BP Location: Right Arm)   Pulse 64   Temp 97.9 F (36.6 C)   Resp 18   Ht 6' (1.829 m)   Wt 136.1 kg   SpO2 99%   BMI 40.69 kg/m  Physical  Exam Vitals and nursing note reviewed.  Constitutional:      General: He is not in acute distress.    Appearance: He is well-developed.  HENT:     Head: Normocephalic and atraumatic.  Eyes:     Conjunctiva/sclera: Conjunctivae normal.     Pupils: Pupils are equal, round, and reactive to light.  Cardiovascular:     Rate and Rhythm: Normal rate.     Pulses: Normal pulses.     Heart sounds: No murmur heard. Pulmonary:     Effort: Pulmonary effort is normal. No respiratory distress.  Musculoskeletal:        General: Tenderness present. Normal range of motion.     Cervical back: Normal range of motion and neck supple.     Lumbar back: Bony tenderness present.       Back:  Skin:    General: Skin is warm and dry.     Findings: No erythema or rash.  Neurological:     Mental Status: He is alert and oriented to person, place, and time.     Sensory: No sensory deficit.     Motor: No  weakness.     Gait: Gait normal.  Psychiatric:        Behavior: Behavior normal.     ED Results / Procedures / Treatments   Labs (all labs ordered are listed, but only abnormal results are displayed) Labs Reviewed - No data to display  EKG None  Radiology DG Lumbar Spine Complete Result Date: 04/06/2024 CLINICAL DATA:  Lower back pain after injury last night. EXAM: LUMBAR SPINE - COMPLETE 4+ VIEW COMPARISON:  October 21, 2021. FINDINGS: No fracture or spondylolisthesis is noted. Disc spaces are well-maintained. Mild to moderate anterior osteophyte formation is noted at L3-4, L4-5 and L5-S1. Large right-sided osteophyte is noted at L2-3. IMPRESSION: Multilevel degenerative changes as noted above. No acute abnormality seen. Electronically Signed   By: Lupita Raider M.D.   On: 04/06/2024 11:52    Procedures Procedures    Medications Ordered in ED Medications  ibuprofen (ADVIL) tablet 400 mg (400 mg Oral Given 04/06/24 1204)  acetaminophen (TYLENOL) tablet 1,000 mg (1,000 mg Oral Given 04/06/24  1204)    ED Course/ Medical Decision Making/ A&P                                 Medical Decision Making Amount and/or Complexity of Data Reviewed Radiology: ordered and independent interpretation performed. Decision-making details documented in ED Course.  Risk OTC drugs. Prescription drug management.   Pt with sudden onset of back pain suggestive of possible compression fx vs musclar injury.  No neurovascular compromise and no incontinence.  Pt has no infectious sx, hx of CA .  Pt is able to ambulate.  Normal strength and sensation on exam.  Plain films pending. Will give pt pain control and to return for developement of above sx.  I have independently visualized and interpreted pt's images today.  Lumbar films without sign of compression fracture.  Radiology reports multilevel degenerative changes but no acute findings.  This was discussed with the patient.  At this time he was recommended supportive care with Tylenol, ibuprofen or Salonpas patches.  He is comfortable with this plan and was discharged home in good condition.         Final Clinical Impression(s) / ED Diagnoses Final diagnoses:  Acute midline low back pain without sciatica    Rx / DC Orders ED Discharge Orders     None         Gwyneth Sprout, MD 04/06/24 1216

## 2024-04-06 NOTE — Discharge Instructions (Addendum)
 The x-rays show that you have a lot of arthritis but everything else looks okay.  You can take Tylenol or ibuprofen as needed or even over-the-counter Salonpas or other lidocaine patches

## 2024-08-24 ENCOUNTER — Other Ambulatory Visit: Payer: Self-pay

## 2024-08-24 ENCOUNTER — Encounter (HOSPITAL_COMMUNITY): Payer: Self-pay

## 2024-08-24 ENCOUNTER — Emergency Department (HOSPITAL_COMMUNITY)
Admission: EM | Admit: 2024-08-24 | Discharge: 2024-08-24 | Disposition: A | Discharge status: Home / Self Care | Payer: Self-pay | Attending: Emergency Medicine | Admitting: Emergency Medicine

## 2024-08-24 DIAGNOSIS — S39012A Strain of muscle, fascia and tendon of lower back, initial encounter: Secondary | ICD-10-CM | POA: Insufficient documentation

## 2024-08-24 DIAGNOSIS — Z760 Encounter for issue of repeat prescription: Secondary | ICD-10-CM | POA: Insufficient documentation

## 2024-08-24 DIAGNOSIS — Z7982 Long term (current) use of aspirin: Secondary | ICD-10-CM | POA: Insufficient documentation

## 2024-08-24 DIAGNOSIS — X58XXXA Exposure to other specified factors, initial encounter: Secondary | ICD-10-CM | POA: Insufficient documentation

## 2024-08-24 MED ORDER — AMLODIPINE BESYLATE 10 MG PO TABS
10.0000 mg | ORAL_TABLET | Freq: Every day | ORAL | 0 refills | Status: AC
Start: 2024-08-24 — End: ?

## 2024-08-24 MED ORDER — METHOCARBAMOL 500 MG PO TABS: 500.0000 mg | ORAL_TABLET | Freq: Two times a day (BID) | ORAL | 0 refills | Status: AC

## 2024-08-24 NOTE — ED Triage Notes (Signed)
 C/O lower back pain that started yesterday. Denies strenuous activity. Denies urinary issues.

## 2024-08-24 NOTE — ED Provider Notes (Signed)
 [name] EM[facility]GENCY DE[facility] AT [facility] Provider Note   CSN: [id number] Arrival date & time: 08/24/24  1242     Patient presents with: Back Pain   [name] is a 63 y.o. male.  Patient with past history significant for pulmonary edema and hypertensive emergency as well as obesity presents to the ED with concerns of back pain.  Reports that he began experience left-sided low back pain starting yesterday.  No obvious injury, fall, or strain that he can recall.  He does report that he works in a somewhat physical job working in [employer] at [employer].  He does state that he has to lift items at times but denies any recent strain as far as he can tell.  States that pain worsens with movement.  No reported urinary changes.  No fever, chills or bodyaches.  Denies any chest pain or shortness of breath.  No history of IV drug use.  No history of prolonged steroid use.  Denies any weight loss.  No prior history of malignancy.  No reported tingling, numbness, or paresthesias on either side lower extremity.  Denies any bowel or bladder incontinence or saddle paresthesia.   Back Pain      Prior to Admission medications   Medication Sig Start Date End Date Taking? Authorizing Provider  amLODipine  (NORVASC ) 10 MG tablet Take 1 tablet (10 mg total) by mouth daily. 08/24/24  Yes Xiong Haidar A, PA-C  methocarbamol  (ROBAXIN ) 500 MG tablet Take 1 tablet (500 mg total) by mouth 2 (two) times daily. 08/24/24  Yes Kourtnie Sachs A, PA-C  acetaminophen  (TYLENOL ) 500 MG tablet Take 500 mg by mouth every 6 (six) hours as needed for moderate pain (pain score 4-6).    [provider]  aspirin  EC 81 MG tablet Take 1 tablet (81 mg total) by mouth daily. Swallow whole. 12/06/23   Amin, Ankit C, MD  atorvastatin  (LIPITOR) 40 MG tablet Take 1 tablet (40 mg total) by mouth daily. 12/07/23   Amin, Ankit C, MD  naproxen  (NAPROSYN ) 375 MG tablet Take 1 tablet (375 mg total) by mouth  2 (two) times daily. 01/01/24   Minnie Tinnie BRAVO, PA    Allergies: Patient has no known allergies.    Review of Systems  Musculoskeletal:  Positive for back pain.  All other systems reviewed and are negative.   Updated Vital Signs BP (!) 153/125   Pulse 68   Temp (!) 97.5 F (36.4 C)   Resp 17   Ht 6' (1.829 m)   Wt 136.1 kg   SpO2 95%   BMI 40.69 kg/m   Physical Exam Vitals and nursing note reviewed.  Constitutional:      General: He is not in acute distress.    Appearance: He is well-developed.  HENT:     Head: Normocephalic and atraumatic.  Eyes:     Conjunctiva/sclera: Conjunctivae normal.  Cardiovascular:     Rate and Rhythm: Normal rate and regular rhythm.     Heart sounds: No murmur heard. Pulmonary:     Effort: Pulmonary effort is normal. No respiratory distress.     Breath sounds: Normal breath sounds.  Abdominal:     Palpations: Abdomen is soft.     Tenderness: There is no abdominal tenderness.  Musculoskeletal:        General: Tenderness present. No swelling, deformity or signs of injury. Normal range of motion.       Arms:  Cervical back: Neck supple.     Comments: Tenderness palpation of the left lower back.  No step-off deformities on the midline lumbar spine.  No tenderness to the left or right hips.  Skin:    General: Skin is warm and dry.     Capillary Refill: Capillary refill takes less than 2 seconds.  Neurological:     Mental Status: He is alert.  Psychiatric:        Mood and Affect: Mood normal.     (all labs ordered are listed, but only abnormal results are displayed) Labs Reviewed - No data to display  EKG: None  Radiology: No results found.   Procedures   Medications Ordered in the ED - No data to display                                  Medical Decision Making  This patient presents to the ED for concern of back pain.  Differential diagnosis includes lumbar strain, UTI, pyelonephritis, urolithiasis, lumbar  radiculopathy   Problem List / ED Course:  Patient with past history significant for obesity, hypertension, and pulmonary who presents to the ED with concerns of back pain.  Reports nontraumatic back pain that started yesterday.  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  No reported fall, other trauma.  Denies any tingling, numbness, or paresthesia to either lower extremity.  No reported bowel or bladder incontinence or saddle paresthesia. Physical exam reveals negative straight leg bilaterally.  There is some tenderness palpation directly over the left paraspinal muscles in the lumbar region.  No significant midline tenderness. No obvious red flag symptoms of low back pain.  Suspect likely lumbar strain.  Will start patient on a course of Robaxin  instructed patient to take ibuprofen  or naproxen  for pain as needed.  Will also refill patient's blood pressure medication, amlodipine  10 mg daily, advised patient to follow-up with primary care provider for further refills of this medication.  He is otherwise stable this time for outpatient follow-up and discharged home.   Social Determinants of Health:  None  Final diagnoses:  Strain of lumbar region, initial encounter  Encounter for medication refill    ED Discharge Orders          Ordered    amLODipine  (NORVASC ) 10 MG tablet  Daily        08/24/24 1738    methocarbamol  (ROBAXIN ) 500 MG tablet  2 times daily        08/24/24 1738               Giavanna Kang A, PA-C 08/24/24 1740    Pamella Ozell LABOR, DO 09/01/24 1117

## 2024-08-24 NOTE — Discharge Instructions (Signed)
 You were seen today in the emergency department for concerns of back pain.  Based on your exam, I do suspect likely have a strain to your lumbar spine that is causing her current pain.  You can take ibuprofen  or naproxen  for pain as needed.  I am starting a medication called Robaxin  to further help with the pain.  I have also refilled her blood pressure medication, amlodipine  10 mg, that you will take once daily.  Please follow-up with your primary care fighter for further refills of your medication.  For any concerns of new or worsening symptoms, return to the emergency department.
